# Patient Record
Sex: Female | Born: 1965 | Race: White | Hispanic: No | Smoking: Former smoker
Health system: Southern US, Community
[De-identification: ages and names within clinical notes are randomized; demographics above are authoritative.]

## PROBLEM LIST (undated history)

## (undated) DIAGNOSIS — T7840XA Allergy, unspecified, initial encounter: Secondary | ICD-10-CM

## (undated) DIAGNOSIS — F32A Depression, unspecified: Secondary | ICD-10-CM

## (undated) DIAGNOSIS — F329 Major depressive disorder, single episode, unspecified: Secondary | ICD-10-CM

## (undated) DIAGNOSIS — F419 Anxiety disorder, unspecified: Secondary | ICD-10-CM

## (undated) DIAGNOSIS — K219 Gastro-esophageal reflux disease without esophagitis: Secondary | ICD-10-CM

## (undated) HISTORY — DX: Allergy, unspecified, initial encounter: T78.40XA

## (undated) HISTORY — DX: Depression, unspecified: F32.A

## (undated) HISTORY — DX: Anxiety disorder, unspecified: F41.9

## (undated) HISTORY — DX: Major depressive disorder, single episode, unspecified: F32.9

## (undated) HISTORY — PX: TUBAL LIGATION: SHX77

## (undated) HISTORY — PX: WISDOM TOOTH EXTRACTION: SHX21

## (undated) HISTORY — PX: TONSILLECTOMY: SUR1361

## (undated) HISTORY — DX: Gastro-esophageal reflux disease without esophagitis: K21.9

---

## 1995-11-29 HISTORY — PX: CERVICAL CONIZATION W/BX: SHX1330

## 1996-05-30 HISTORY — PX: OTHER SURGICAL HISTORY: SHX169

## 2002-06-30 HISTORY — PX: ABDOMINAL HYSTERECTOMY: SHX81

## 2002-06-30 HISTORY — PX: APPENDECTOMY: SHX54

## 2004-09-02 ENCOUNTER — Ambulatory Visit: Payer: Self-pay | Admitting: Pulmonary Disease

## 2004-09-27 ENCOUNTER — Ambulatory Visit: Payer: Self-pay | Admitting: Pulmonary Disease

## 2004-10-23 ENCOUNTER — Ambulatory Visit: Payer: Self-pay | Admitting: Pulmonary Disease

## 2005-01-22 ENCOUNTER — Ambulatory Visit: Payer: Self-pay | Admitting: Pulmonary Disease

## 2005-07-08 ENCOUNTER — Ambulatory Visit: Payer: Self-pay | Admitting: Unknown Physician Specialty

## 2006-03-16 ENCOUNTER — Other Ambulatory Visit: Payer: Self-pay

## 2006-03-16 ENCOUNTER — Observation Stay: Payer: Self-pay | Admitting: Internal Medicine

## 2006-07-22 ENCOUNTER — Ambulatory Visit: Payer: Self-pay

## 2006-07-22 LAB — HM MAMMOGRAPHY

## 2006-09-21 ENCOUNTER — Ambulatory Visit: Payer: Self-pay | Admitting: Gynecology

## 2006-10-26 ENCOUNTER — Ambulatory Visit: Payer: Self-pay | Admitting: Gynecology

## 2007-06-22 ENCOUNTER — Ambulatory Visit: Payer: Self-pay | Admitting: Pulmonary Disease

## 2007-06-22 DIAGNOSIS — J4530 Mild persistent asthma, uncomplicated: Secondary | ICD-10-CM | POA: Insufficient documentation

## 2007-06-22 DIAGNOSIS — R5381 Other malaise: Secondary | ICD-10-CM

## 2007-06-22 DIAGNOSIS — R5383 Other fatigue: Secondary | ICD-10-CM

## 2007-06-30 ENCOUNTER — Encounter: Payer: Self-pay | Admitting: Critical Care Medicine

## 2007-07-16 ENCOUNTER — Ambulatory Visit: Payer: Self-pay | Admitting: Pulmonary Disease

## 2007-07-16 DIAGNOSIS — R942 Abnormal results of pulmonary function studies: Secondary | ICD-10-CM | POA: Insufficient documentation

## 2007-07-16 DIAGNOSIS — R0609 Other forms of dyspnea: Secondary | ICD-10-CM

## 2007-07-16 DIAGNOSIS — R0602 Shortness of breath: Secondary | ICD-10-CM | POA: Insufficient documentation

## 2007-07-16 DIAGNOSIS — R0989 Other specified symptoms and signs involving the circulatory and respiratory systems: Secondary | ICD-10-CM

## 2007-07-27 ENCOUNTER — Ambulatory Visit: Payer: Self-pay

## 2007-07-27 ENCOUNTER — Encounter: Payer: Self-pay | Admitting: Pulmonary Disease

## 2007-07-30 ENCOUNTER — Ambulatory Visit: Payer: Self-pay | Admitting: Unknown Physician Specialty

## 2007-07-30 LAB — HM COLONOSCOPY

## 2007-08-03 ENCOUNTER — Ambulatory Visit: Payer: Self-pay | Admitting: Pulmonary Disease

## 2007-08-03 DIAGNOSIS — G4710 Hypersomnia, unspecified: Secondary | ICD-10-CM

## 2007-11-11 ENCOUNTER — Ambulatory Visit: Payer: Self-pay | Admitting: Gynecology

## 2008-02-10 ENCOUNTER — Ambulatory Visit: Payer: Self-pay | Admitting: Nurse Practitioner

## 2008-07-07 ENCOUNTER — Ambulatory Visit: Payer: Self-pay | Admitting: Unknown Physician Specialty

## 2008-07-13 ENCOUNTER — Ambulatory Visit: Payer: Self-pay | Admitting: Unknown Physician Specialty

## 2008-11-14 ENCOUNTER — Ambulatory Visit: Payer: Self-pay | Admitting: Nurse Practitioner

## 2009-01-16 ENCOUNTER — Ambulatory Visit: Payer: Self-pay | Admitting: Obstetrics & Gynecology

## 2009-01-23 ENCOUNTER — Ambulatory Visit: Payer: Self-pay | Admitting: Unknown Physician Specialty

## 2009-02-20 ENCOUNTER — Ambulatory Visit: Payer: Self-pay | Admitting: Nurse Practitioner

## 2009-03-13 ENCOUNTER — Ambulatory Visit: Payer: Self-pay | Admitting: Nurse Practitioner

## 2009-04-10 ENCOUNTER — Ambulatory Visit: Payer: Self-pay | Admitting: Nurse Practitioner

## 2009-09-18 ENCOUNTER — Ambulatory Visit: Payer: Self-pay | Admitting: Nurse Practitioner

## 2010-01-23 ENCOUNTER — Ambulatory Visit: Payer: Self-pay | Admitting: Unknown Physician Specialty

## 2010-02-12 ENCOUNTER — Ambulatory Visit: Payer: Self-pay | Admitting: Otolaryngology

## 2010-03-12 ENCOUNTER — Ambulatory Visit: Payer: Self-pay | Admitting: Nurse Practitioner

## 2010-08-27 ENCOUNTER — Encounter (INDEPENDENT_AMBULATORY_CARE_PROVIDER_SITE_OTHER): Payer: BC Managed Care – PPO | Admitting: Nurse Practitioner

## 2010-08-27 DIAGNOSIS — G43109 Migraine with aura, not intractable, without status migrainosus: Secondary | ICD-10-CM

## 2010-08-28 ENCOUNTER — Encounter (INDEPENDENT_AMBULATORY_CARE_PROVIDER_SITE_OTHER): Payer: Self-pay | Admitting: *Deleted

## 2010-08-28 LAB — CONVERTED CEMR LAB: Hgb A1c MFr Bld: 5.2 % (ref <5.7)

## 2010-09-20 NOTE — Assessment & Plan Note (Unsigned)
Hannah [name], CENTANNI NO.:  [id number]  MEDICAL RECORD NO.:  [id number]           PATIENT TYPE:  LOCATION:  [facility] at [facility]           FACILITY:  PHYSICIAN:  Remonia Richter, NP   DATE OF BIRTH:  03-01-1966  DATE OF SERVICE:  08/27/2010                                 CLINIC NOTE  HISTORY OF PRESENT ILLNESS:  The patient comes to the office today for headaches and followup on her headache and some ongoing vertigo.  The patient has been having episodes of vertigo since last summer.  The episodes seem to happen randomly.  She feels that the room shifts side to side lasting 2-5 minutes.  She does have some nausea that resolves after about 20 minutes with no vomiting.  She had an episode that happened severely the summer causing her to actually fall.  She also has some issues with driving when she has some distortion of her hands on the wheels.  She did see her ear, nose, and throat physician who did some positioning testing as well as hearing testing and an MRI of her head all were negative.  She also saw her regular internist who did not have any advice for her episodes of vertigo.  Vital signs blood pressure is 114/76, pulse 73, weight 150, and height is 5 feet.  ASSESSMENT: 1. Migraine headache. 2. Vertigo.  PLAN:  After a very lengthy discussion, we have decided today that we will drop her Wellbutrin from 150 mg down to 75 mg.  She will get a prescription for #30 with 3 refills, and this added to the 200 mg of Topamax may be the culprit.  We will give this a trial.  She is also asking to have her blood sugar checked today as her father is a diabetic and this runs in her family.  She will have a hemoglobin A1c drawn today.  The patient is asked to call back to the office if she has any issues that are ongoing, otherwise we will see her back in 6 weeks.     Remonia Richter, NP    LR/MEDQ  D:  08/27/2010  T:  08/28/2010  Job:  [id number]

## 2010-10-15 ENCOUNTER — Encounter (INDEPENDENT_AMBULATORY_CARE_PROVIDER_SITE_OTHER): Payer: BC Managed Care – PPO | Admitting: Nurse Practitioner

## 2010-10-15 DIAGNOSIS — G43109 Migraine with aura, not intractable, without status migrainosus: Secondary | ICD-10-CM

## 2010-10-16 NOTE — Assessment & Plan Note (Unsigned)
NAMEMarland Kitchen  Hannah, BARBIAN NO.:  [id number]  MEDICAL RECORD NO.:  [id number]           PATIENT TYPE:  LOCATION:  [facility] at [facility]           FACILITY:  PHYSICIAN:  Argentina Donovan, MD             DATE OF BIRTH:  DATE OF SERVICE:  10/15/2010                                 CLINIC NOTE  SUBJECTIVE:  The patient comes to the office today for followup on her migraine headaches.  The patient was last seen in February 2012.  At that time, she was complaining of episodes that she was having when she was driving that involved some distortion of her hands on the wheels. She has seen ENT who had done some testing of her position as well as hearing tests and MRI which were all negative.  She had also seen her regular internist who did not have any advice for her.  We did check her hemoglobin A1c which was negative.  We also dropped her Wellbutrin down from 150 mg to 75 mg.  That did seem to be the answer for her.  She has not had any of those episodes.  There were several times when she felt she may have one, but it did not actually occur.  She does feel more irritable on the decreased dose of Wellbutrin.  She has no irritability with her husband in general.  The patient is also complaining of upper respiratory infection.  Her husband and daughter are both sick.  She does not believe this is related to allergy.  She has not had any fever. Symptoms have been ongoing for approximately 1 week.  OBJECTIVE:  VITAL SIGNS:  Blood pressure 105/73, weight 147, and pulse is 75. HEENT:  Head is normocephalic and atraumatic.  Pupils are equal and react. CARDIAC:  Regular rate and rhythm. LUNGS:  Clear bilaterally. NEUROLOGICAL:  The patient is alert and oriented.  Good mood.  Good muscle tone.  Good affect.  ASSESSMENT: 1. Migraine. 2. Upper respiratory infection. 3. Irritability.  PLAN:  We had a lengthy discussion today.  The patient is advised to seek counseling.  She was given  the card from the counselor that we use in this office and will follow up with her.  She is not interested in moving her medications around even further.  She feels that if she goes even lower on her Wellbutrin that it may be the cause of her having more irritability within her marriage.  She is given Z-Pak to take as directed in the event that her symptoms progressed to fever, cough with yellow or green sputum.  She will follow up in 6 months or sooner as need be.     Remonia Richter, NP   ______________________________ Argentina Donovan, MD  LR/MEDQ  D:  10/15/2010  T:  10/16/2010  Job:  [id number]

## 2010-11-12 NOTE — Assessment & Plan Note (Signed)
NAMEARRAYAH, CONNORS               ACCOUNT NO.:  [id number]   MEDICAL RECORD NO.:  [id number]          PATIENT TYPE:  POB   LOCATION:  [facility] at [facility]         FACILITY:  [facility]   PHYSICIAN:  Remonia Richter, NP   DATE OF BIRTH:  [birth year]   DATE OF SERVICE:                                  CLINIC NOTE   The patient comes to office today for follow up on her migraine  headaches.  She did call Dr. Lafayette Dragon who okayed the switch from Lamictal to  Topamax.  She did not feel that it would be any issue with her  depression.  Since the patient has started Topamax, she is down 4  pounds, and she is pleased with that.  She is currently taking Lamictal  at 200 mg and she is currently up to 75 mg on the Topamax.  She is doing  great overall with her headaches.  She did have one of that headache and  that is related to taking her son to the emergency room following an  emergency room visit and having to wait and watch her son in pain for 6  hours.   EXAM:  VITAL SIGNS:  Blood pressure is 113/76, pulse is 66, weight is  162.   ALLERGIES:  The patient is allergic to SULFA.   ASSESSMENT:  1. Migraine headaches  2. Depression.   PLAN:  We have discussed lowering the dose of Lamictal.  The patient is  comfortable taking her 200 mg tablet of Lamictal and breaking it in  half, and she will be taking 100 mg at bedtime for 4 weeks.  She will  also go up on her Topamax to 100 mg tonight.  After that we will likely  go down to the 50 mg on the Lamictal.  The patient will return to the  clinic in 4 weeks.  She is encouraged to take Aleve around stressful  events.      Remonia Richter, NP     LR/MEDQ  D:  03/13/2009  T:  03/14/2009  Job:  [id number]

## 2010-11-12 NOTE — Assessment & Plan Note (Signed)
Hannah [name], Hannah [name]               ACCOUNT NO.:  [id number]   MEDICAL RECORD NO.:  [id number]          PATIENT TYPE:  POB   LOCATION:  [facility] at [facility]         FACILITY:  [facility]   PHYSICIAN:  Tinnie Gens, MD        DATE OF BIRTH:  May 15, 1966   DATE OF SERVICE:  09/18/2009                                  CLINIC NOTE   The patient comes into the office today for followup on her migraine  headaches.  She was seen since 6 months ago.  Since her last office  visit, she has tapered down and off of her Lamictal and gone up to 200  mg of Topamax.  She states that she cannot remember the last time she  had a headache that she continues to carry her Triptan in her pocket,  but that is actually become dusty.  She is interested in decreasing the  dose of the Topamax due to some memory issues and has decided that she  wants to go down to 150.  She has lost a total of 11 pounds which is  great for her.  She is also suffering from extensive allergies at this  point.  She has been to see her primary care doctor and is currently on  prednisone.   PHYSICAL EXAMINATION:  VITAL SIGNS:  Blood pressure is 102/66, pulse 81,  weight 154, height is 5 feet 5.   ALLERGIES:  SULFA, ERYTHROMYCIN, and TETRACYCLINE.   ASSESSMENT:  1. Migraine.  2. Allergy.   PLAN:  1. The patient will be referred to see an allergist in [place].      She will continue on her allergy medicines that she has now.  2. We will decrease her Topamax to 50 mg 3 tablets at bedtime #90 with      6 refills.  She is given permission to play with that dosing.  If      she wants to try to go down to 100, that is fine.  We will see her      back in 6 months or sooner as need be.      Remonia Richter, NP    ______________________________  Tinnie Gens, MD    LR/MEDQ  D:  09/18/2009  T:  09/18/2009  Job:  [id number]

## 2010-11-12 NOTE — Assessment & Plan Note (Signed)
NAMESHAMERA, YARBERRY               ACCOUNT NO.:  [id number]   MEDICAL RECORD NO.:  [id number]          PATIENT TYPE:  POB   LOCATION:  [facility] at [facility]         FACILITY:  [facility]   PHYSICIAN:  Jaynie Collins, MD     DATE OF BIRTH:  [birth year]   DATE OF SERVICE:  02/20/2009                                  CLINIC NOTE   The patient comes to the office today for followup on her migraine  headaches.  The patient states that she is having approximately 2-3  moderate-to-severe headaches per week.  She has been taking her Relpax  and Aleve and that has been working relatively well for her, but she  would like to not have as many headaches.  She currently is on Lamictal  as well as Wellbutrin for stabilization of her depression which is  working very well.  She currently sees Dr. Lafayette Dragon for psychiatric care.   PHYSICAL EXAMINATION:  VITAL SIGNS:  Blood pressure is 99/63, weight is  166, height is 5 foot, pulse is 74.   ALLERGIES:  To SULFA.   ASSESSMENT:  Migraine headaches.   PLAN:  We had a rather lengthy discussion today concerning triggers.  She was asked to eat a lunch so as not to trigger a headache in the  early afternoon.  We also asked her to be aware of tension headaches and  when she gets a tension headache, she will take a Norflex one p.o. q.8  h. as prescribed.  We also talked about starting Topamax at a low dose.  She will start 25 mg for 1 week, up to 50 for 1 week, and then up to 75  for 1 week until seen.  She does think that she is gaining weight on the  Lamictal.  She will call her psychiatrist, Dr. Lafayette Dragon, and ask her if she  has any disagreement with switching over the Topamax for the Lamictal.  If she does not have any issues, we will slowly switch her over.  She  will return to the clinic in 3 weeks.      Remonia Richter, NP    ______________________________  Jaynie Collins, MD    LR/MEDQ  D:  02/20/2009  T:  02/21/2009  Job:  [id number]

## 2010-11-12 NOTE — Assessment & Plan Note (Signed)
NAMEANAE, HAMS               ACCOUNT NO.:  [id number]   MEDICAL RECORD NO.:  [id number]          PATIENT TYPE:  POB   LOCATION:  [facility]         FACILITY:  [facility]   PHYSICIAN:  Scheryl Darter, MD       DATE OF BIRTH:  [birth year]   DATE OF SERVICE:  01/16/2009                                  CLINIC NOTE   The patient comes for yearly physical exam.  The patient is a 45-year-  old white female, gravida 3, para 2, abortus 1, status post laparoscopic  assisted vaginal hysterectomy in 2004 for endometriosis.  The patient is  on Premarin for hormone replacement.  She has good control of vasomotor  symptoms.  She has diagnosis of migraine headaches and she has to treat  these fairly frequently with Relpax which does work well.  She says she  may have had to do this 3 times in the last week.  She still does not  want to start any more medication for prevention, however.   PAST MEDICAL HISTORY:  1. Moderate obesity.  2. Migraine headaches.  3. Asthma and allergies.  4. Depression.   MEDICATIONS:  1. Lamictal 200 mg p.o. daily.  2. Wellbutrin XL 150 mg a day.  3. Premarin 0.625 mg a day.  4. Zyrtec over the counter 1 a day.  5. Flonase 2 sprays every morning.  6. Advair inhaler 2 puffs b.i.d.  7. Relpax 40 mg 1 p.o. p.r.n. headache.   FAMILY HISTORY:  Colon cancer in her brother, who is 67.  Her son also  had a colonoscopy which, she believes, showed some colonic polyps but  she is not sure what the diagnosis is.   REVIEW OF SYSTEMS:  No vasomotor symptoms, urinary symptoms, vaginal  discharge, or dryness.  She has frequent headaches.   PHYSICAL EXAMINATION:  GENERAL:  The patient is in no acute distress.  VITAL SIGNS:  Weight is 160 pounds, height 5 feet 0, BP is 99/67, and  pulse 78.  CHEST:  Clear.  HEART:  Regular rate and rhythm.  NECK:  No thyromegaly.  BREASTS:  Symmetric.  No mass, tenderness, or axillary lymphadenopathy.  ABDOMEN:  Soft, nontender, no  mass.  EXTREMITIES:  No swelling.  EXTERNAL GENITALIA:  Vagina showed good estrogen effect and good  support.  No pelvic masses or tenderness.  Pap smear was deferred today.   IMPRESSION:  The patient is status post hysterectomy for endometriosis  with frequent headaches, currently on Relpax.  She does not want to  start a preventive regimen for her migraines at this time as she thinks  she is already on too much medication.   PLAN:  The patient will report back if she wants to see Remonia Richter, NP, for further management of her headaches.  She sees Dr.  Elease Hashimoto for her other medical care.  She should return for yearly exams.  I recommend she schedule screening mammography.      Scheryl Darter, MD     JA/MEDQ  D:  01/16/2009  T:  01/17/2009  Job:  [id number]

## 2010-11-12 NOTE — Assessment & Plan Note (Signed)
Hannah [name], Hannah [name]               ACCOUNT NO.:  [id number]   MEDICAL RECORD NO.:  [id number]          PATIENT TYPE:  POB   LOCATION:  [facility] at [facility]         FACILITY:  [facility]   PHYSICIAN:  Tinnie Gens, MD        DATE OF BIRTH:  30-Nov-1965   DATE OF SERVICE:  11/14/2008                                  CLINIC NOTE   The patient comes to office today for followup on her headaches.  The  patient was last seen in August 2009.  She was asked to follow up in 6  months or sooner.  This is her first headache follow up.  Since then,  she has called in several times for prescriptions for Vicodin and  Darvocet.  She states that she had side effects with the Imitrex and she  has been taking the pain medicine.  She had been doing very well with  her headaches up until approximately 1 month ago and now she is actually  having many more headaches.  She states now she is having an headache  approximately every other day.  She is unsure of what the trigger is.  She is considered diet sodas, allergies, asthma, thyroid issues, and  some body and joint aches.  Things are going well for her at home.  She  does not have any stressors including marriage, job, money.  She does  not feel that those things are an issue for her.   PHYSICAL EXAMINATION:  VITAL SIGNS:  Blood pressure is 118/68, pulse is  81, weight is 165, height is 5 feet, 0 inch.   ASSESSMENT:  Migraine headache.   PLAN:  We did have a rather lengthy discussion today probably over 30  minutes.  We originally switched her prescription to Maxalt.  She did  call back later in the day and say that her insurance would not pay for  Maxalt, so we will change that to Relpax 40 mg one p.o. p.r.n. migraine  limit 2 tablets in a 24-hour #9 with 6 refills.  She is also asked to  add Anaprox to that.  She is also asked to consider prevention.  She is  struggling with some weight loss and so we have suggested Topamax due to  the weight loss  properties.  She is asked to return on an as-needed  basis.       Remonia Richter, NP    ______________________________  Tinnie Gens, MD    LR/MEDQ  D:  11/14/2008  T:  11/15/2008  Job:  [id number]

## 2010-11-12 NOTE — Assessment & Plan Note (Signed)
Hannah [name], POLLINA               ACCOUNT NO.:  [id number]   MEDICAL RECORD NO.:  [id number]          PATIENT TYPE:  POB   LOCATION:  [facility]         FACILITY:  [facility]   PHYSICIAN:  Ginger Carne, MD DATE OF BIRTH:  Feb 13, 1966   DATE OF SERVICE:  11/11/2007                                  CLINIC NOTE   HISTORY OF PRESENT ILLNESS:  [name] is here today for her routine  gynecological evaluation.  She had utilized Premarin vaginal cream in  the past and discontinued it and now continues to have dyspareunia.  She  was prescribed this medication about 1 year ago.  Also her Premarin  tablets 0.625 are working well for her.  She had been seen by Dr. Darrol Angel  at the [facility] pulmonology department and a sleep apnea study was  ordered.  The patient had a pulmonary infection in the early spring of  this year and needs to reschedule said testing.  She had a mammogram  about 1 year ago which was ordered by Dr. Elease Hashimoto.  I do not have the  results, but she states they were normal.  She has no GI, GU or cardiac  complaints.  No new family history of cancer.  Her father has been a  changed from type 2 to type 1 diabetes mellitus.  Her physical  examination is essentially normal and the record is in the chart.   PLAN:  Renew Premarin 0.625 mg daily #30 with 12 refills and Premarin  vaginal cream 1 gram to be used two-to-three times a week intravaginally  as needed.  The patient will return on a p.r.n. basis.  Otherwise 1-year  visit at that time, a mammogram will be ordered.  Of note is the patient  did have in March 2006 a DEXA scan which was normal.           ______________________________  Ginger Carne, MD     SHB/MEDQ  D:  11/11/2007  T:  11/11/2007  Job:  [id number]

## 2010-11-12 NOTE — Assessment & Plan Note (Signed)
NAMEKHRISTIN, KELEHER               ACCOUNT NO.:  [id number]   MEDICAL RECORD NO.:  [id number]          PATIENT TYPE:  POB   LOCATION:  [facility]         FACILITY:  [facility]   PHYSICIAN:  Tinnie Gens, MD        DATE OF BIRTH:  22-Oct-1965   DATE OF SERVICE:  04/10/2009                                  CLINIC NOTE   The patient comes to office today for followup on migraine headaches.  The patient has been doing very well.  She is currently on 100 mg of  Topamax and 100 mg of Lamictal.  She takes each one of these at bedtime.  She was very impressed because she has not had any headache.  Her  beloved dog of 10 years died, she cried extensively and did not have a  headache after that.  We have decided that she will go up on her Topamax  ultimately getting to 200 and down on her Lamictal and off.   PHYSICAL EXAMINATION:  VITAL SIGNS:  Blood pressure is 115/74, pulse is  64, weight 161, height 5 feet 5 inches.   ALLERGIES:  SULFA.   ASSESSMENT:  Migraine headaches.   PLAN:  The patient will go up to Topamax 150 mg for 4 weeks and then up  to 200 mg.  She was given a prescription for Topamax 200 mg 1 p.o.  nightly #90 with 1 refill.  She will go down on her Lamictal to 50 mg  for 4 weeks and then go off.  The patient is asked to increase her  fluids.  She will return to the clinic in 6 months or sooner as need be.      Remonia Richter, NP    ______________________________  Tinnie Gens, MD    LR/MEDQ  D:  04/10/2009  T:  04/11/2009  Job:  [id number]

## 2010-11-12 NOTE — Assessment & Plan Note (Signed)
Hannah [name], Hannah [name]               ACCOUNT NO.:  [id number]   MEDICAL RECORD NO.:  [id number]          PATIENT TYPE:  POB   LOCATION:  [facility]         FACILITY:  [facility]   PHYSICIAN:  Elsie Lincoln, MD      DATE OF BIRTH:  07-Apr-1966   DATE OF SERVICE:  02/10/2008                                  CLINIC NOTE   The patient comes to the office today for consultation for migraine  headache.  The patient's headache started approximately at the age of  45.  She denies any aura.  Her headaches occur in her neck and her  temples and sometimes can involve her entire head.  She does have  sensitivity to light and sound.  She rarely has nausea or vomiting.  She  is currently having one severe per month, four moderates per month, two  milds per month.  She does has been taking Fiorinal With Codeine given  to her by Dr. Blima Rich.  She has never taken a Triptan.  She does  currently see Dr. Evelene Croon for depression.  She is currently on Lamictal as  well as Wellbutrin.  She does feel that her headache triggers include  stress as well as weather.  She has currently had a complete  hysterectomy for endometriosis and is currently on Premarin.  She denies  any family history of heart disease.  She does not have high blood  pressure.  She has not had her cholesterol checked.  She does not have  any cardiac issues herself personally.   PHYSICAL EXAMINATION:  VITAL SIGNS:  Blood pressure is 94/63, weight is  158, height is 5 feet 5 inches, pulse is 59.  GENERAL:  Well-developed, well-nourished, 45 year old female in no acute  distress.  HEENT:  Head is normocephalic and atraumatic.  NEUROLOGICALLY:  The patient is alert.  She is oriented.  She is well  spoken.  Her speech is fluent.  She has good muscle coordination and  sensation.  CARDIAC:  Regular rate and rhythm.  No murmurs, rubs, or thrills.  LUNGS:  Clear bilaterally without rales, rhonchi, or wheezes.   ASSESSMENT/PLAN:  Migraine  headache without aura.  The patient does not  need prevention at this point.  She is doing very well with the number  of headaches that she has.  We will start her on Imitrex to take 1 p.o.  p.r.n. migraine.   She will follow up in 6 months or sooner as need be.      Remonia Richter, NP    ______________________________  Elsie Lincoln, MD    LR/MEDQ  D:  02/10/2008  T:  02/11/2008  Job:  [id number]

## 2010-11-12 NOTE — Assessment & Plan Note (Signed)
[name], [name]               ACCOUNT NO.:  [id number]   MEDICAL RECORD NO.:  [id number]          PATIENT TYPE:  POB   LOCATION:  [facility]         FACILITY:  [facility]   PHYSICIAN:  Allie Bossier, MD        DATE OF BIRTH:  1965/09/04   DATE OF SERVICE:  03/12/2010                                  CLINIC NOTE   The patient comes to office today for a followup on her migraine  headaches.  She was last seen in April 2011, at that time she was doing  very well and had decided to drop her Topamax to 150, perhaps down to  100.  When she went to 150, her headaches immediately came back and she  started having to take her Relpax again.  She decided that, that was not  going to work for her, called the office, and had her Topamax increased  to 200 mg again.  Since our last office visit, her brother's colon  cancer has returned, is now in his liver, and he has restarted  chemotherapy, deals with her mother's and father's illnesses, both have  caused her to have some increased stress in her life.  She also has a  daughter who has migraines who is having some hormonal issues.   PHYSICAL EXAMINATION:  GENERAL:  A well-developed, well-nourished 45-  year-old Caucasian female, in no acute distress.  VITAL SIGNS:  Blood pressure is 97/73, pulse 69, weight 149, height is 5  feet.  HEENT:  Head is normocephalic and atraumatic.  Pupils equal and  reactive.  NEUROLOGIC:  The patient is alert and oriented.  She has no difficulty  with memory or cognitive issues.  She has good muscle tone and  coordination.   ASSESSMENT:  Migraine headaches.   PLAN:  We will give her a prescription for Topamax 200 mg 1 p.o. q.h.s.,  #30 with p.r.n. refills as well as Relpax 40 mg 1 p.o. p.r.n. migraine,  #9 with p.r.n. refills.  The patient is asked to return in 1 year or  sooner as need be.      Remonia Richter, NP    ______________________________  Allie Bossier, MD    LR/MEDQ  D:  03/12/2010   T:  03/13/2010  Job:  [id number]

## 2011-01-23 ENCOUNTER — Ambulatory Visit: Payer: Self-pay | Admitting: Unknown Physician Specialty

## 2011-04-15 ENCOUNTER — Ambulatory Visit (INDEPENDENT_AMBULATORY_CARE_PROVIDER_SITE_OTHER): Payer: BC Managed Care – PPO | Admitting: Nurse Practitioner

## 2011-04-15 ENCOUNTER — Encounter: Payer: Self-pay | Admitting: Nurse Practitioner

## 2011-04-15 VITALS — BP 97/60 | HR 64 | Wt 154.0 lb

## 2011-04-15 DIAGNOSIS — F329 Major depressive disorder, single episode, unspecified: Secondary | ICD-10-CM

## 2011-04-15 NOTE — Progress Notes (Deleted)
Diagnosis:***  Location:***  Number of Headache days/month: Severe:*** Moderate:*** Mild:***  No current outpatient prescriptions on file prior to visit.    Acute prevention:***  Past Medical History  Diagnosis Date  . Migraine   . Allergy   . Asthma   . Depression    Past Surgical History  Procedure Date  . Appendectomy   . Abdominal hysterectomy   . Wisdom tooth extraction     x 4  . Tonsillectomy   . Cesarean section      x2   Family History  Problem Relation Age of Onset  . Arthritis Mother   . Depression Mother   . Diabetes Father   . Parkinsonism Father   . Depression Sister   . Cancer Brother     colon, lungs, liver  . Diabetes Paternal Grandmother    Social History:  reports that she has quit smoking. Her smoking use included Cigarettes. She does not have any smokeless tobacco history on file. She reports that she drinks alcohol. She reports that she does not use illicit drugs. Allergies:  Allergies  Allergen Reactions  . Sulfonamide Derivatives   . Tetracyclines & Related     Triggers:***  Birth control:***  ROS:***  Exam:***  Impression:{headache NW:295621}  Plan***  Time Spent:***

## 2011-04-15 NOTE — Progress Notes (Signed)
S: Pt is in office today for follow up on migraine headache. Headaches are doing well. She feels her depression is worse in the last week. Maybe related to weather changes. She also has brother who has cancer and both parents are ill.  We dropped her Wellbutrin down to 75mg  at last visit related to some unusual side effects in which she felt her hands were enlarging on the steering wheel. She was advised at last visit to seek counseling related to some issues with her husband. She has not done that as yet.  O: Cauc female NAD, tearful today. Neuro negative  A: migraine, depression  P: Given name of Dr Merry Proud at [facility] for counseling. Pt declined any medication change in antidepressants. She will call if she changes her mind. Refills of meds prn

## 2011-04-29 ENCOUNTER — Telehealth: Payer: Self-pay | Admitting: *Deleted

## 2011-04-29 MED ORDER — ESTROGENS CONJUGATED 0.625 MG PO TABS: 0.6250 mg | ORAL_TABLET | Freq: Every day | ORAL | Status: DC

## 2011-04-29 NOTE — Telephone Encounter (Signed)
Pharmacy sent request for refill.

## 2011-10-23 ENCOUNTER — Telehealth: Payer: Self-pay | Admitting: *Deleted

## 2011-10-23 MED ORDER — TOPIRAMATE 200 MG PO TABS: 200.0000 mg | ORAL_TABLET | Freq: Every day | ORAL | Status: DC

## 2011-10-23 NOTE — Telephone Encounter (Signed)
Patient is calling for a refill of her Topamax 200mg  qhs.  She is doing well with this.

## 2011-11-13 ENCOUNTER — Telehealth: Payer: Self-pay | Admitting: *Deleted

## 2011-11-13 NOTE — Telephone Encounter (Signed)
Patient called because her last rx we refilled for Topamax was not at the pharmacy.  I called and spoke to the pharmacist directly to confirm it was ok to call in 6 refills.

## 2012-04-05 ENCOUNTER — Telehealth: Payer: Self-pay | Admitting: *Deleted

## 2012-04-05 DIAGNOSIS — G43909 Migraine, unspecified, not intractable, without status migrainosus: Secondary | ICD-10-CM

## 2012-04-05 MED ORDER — ELETRIPTAN HYDROBROMIDE 20 MG PO TABS: 20.0000 mg | ORAL_TABLET | ORAL | Status: DC | PRN

## 2012-04-05 NOTE — Telephone Encounter (Signed)
Patient needs a refill of her relpax and will go ahead and make an appointment, since it has been a year.

## 2012-04-20 ENCOUNTER — Ambulatory Visit (INDEPENDENT_AMBULATORY_CARE_PROVIDER_SITE_OTHER): Payer: BC Managed Care – PPO | Admitting: Nurse Practitioner

## 2012-04-20 ENCOUNTER — Encounter: Payer: Self-pay | Admitting: Nurse Practitioner

## 2012-04-20 VITALS — BP 94/66 | HR 67 | Ht 59.0 in | Wt 154.0 lb

## 2012-04-20 DIAGNOSIS — G43909 Migraine, unspecified, not intractable, without status migrainosus: Secondary | ICD-10-CM

## 2012-04-20 DIAGNOSIS — Z23 Encounter for immunization: Secondary | ICD-10-CM

## 2012-04-20 MED ORDER — TOPIRAMATE 200 MG PO TABS: 200.0000 mg | ORAL_TABLET | Freq: Every day | ORAL | Status: DC

## 2012-04-20 MED ORDER — ELETRIPTAN HYDROBROMIDE 40 MG PO TABS: 40.0000 mg | ORAL_TABLET | ORAL | Status: DC | PRN

## 2012-04-20 NOTE — Progress Notes (Signed)
S: Pt returns today for yearly visit for migraine. She has done well with her headache numbers. About 8 months ago she discontinued her Wellbutrin. She has done well with this change and has had no further side effects. No more vertigo. She has had a hard year related to parents, brother and daughters health issues.   O: Alert, oriented, NAD Cardiac : RRR Lungs: clear bilat Neuro: Negative  A: Migraine headaches  P: Refill Relpax and Topamax. RTC one year or prn

## 2012-04-20 NOTE — Patient Instructions (Signed)
Migraine Headache A migraine headache is an intense, throbbing pain on one or both sides of your head. A migraine can last for 30 minutes to several hours. CAUSES  The exact cause of a migraine headache is not always known. However, a migraine may be caused when nerves in the brain become irritated and release chemicals that cause inflammation. This causes pain. SYMPTOMS  Pain on one or both sides of your head.  Pulsating or throbbing pain.  Severe pain that prevents daily activities.  Pain that is aggravated by any physical activity.  Nausea, vomiting, or both.  Dizziness.  Pain with exposure to bright lights, loud noises, or activity.  General sensitivity to bright lights, loud noises, or smells. Before you get a migraine, you may get warning signs that a migraine is coming (aura). An aura may include:  Seeing flashing lights.  Seeing bright spots, halos, or zig-zag lines.  Having tunnel vision or blurred vision.  Having feelings of numbness or tingling.  Having trouble talking.  Having muscle weakness. MIGRAINE TRIGGERS  Alcohol.  Smoking.  Stress.  Menstruation.  Aged cheeses.  Foods or drinks that contain nitrates, glutamate, aspartame, or tyramine.  Lack of sleep.  Chocolate.  Caffeine.  Hunger.  Physical exertion.  Fatigue.  Medicines used to treat chest pain (nitroglycerine), birth control pills, estrogen, and some blood pressure medicines. DIAGNOSIS  A migraine headache is often diagnosed based on:  Symptoms.  Physical examination.  A CT scan or MRI of your head. TREATMENT Medicines may be given for pain and nausea. Medicines can also be given to help prevent recurrent migraines.  HOME CARE INSTRUCTIONS  Only take over-the-counter or prescription medicines for pain or discomfort as directed by your caregiver. The use of long-term narcotics is not recommended.  Lie down in a dark, quiet room when you have a migraine.  Keep a journal  to find out what may trigger your migraine headaches. For example, write down:  What you eat and drink.  How much sleep you get.  Any change to your diet or medicines.  Limit alcohol consumption.  Quit smoking if you smoke.  Get 7 to 9 hours of sleep, or as recommended by your caregiver.  Limit stress.  Keep lights dim if bright lights bother you and make your migraines worse. SEEK IMMEDIATE MEDICAL CARE IF:   Your migraine becomes severe.  You have a fever.  You have a stiff neck.  You have vision loss.  You have muscular weakness or loss of muscle control.  You start losing your balance or have trouble walking.  You feel faint or pass out.  You have severe symptoms that are different from your first symptoms. MAKE SURE YOU:   Understand these instructions.  Will watch your condition.  Will get help right away if you are not doing well or get worse. Document Released: 06/16/2005 Document Revised: 09/08/2011 Document Reviewed: 06/06/2011 [employer] Patient Information 2013 [employer].  

## 2012-05-13 ENCOUNTER — Other Ambulatory Visit: Payer: Self-pay | Admitting: *Deleted

## 2012-05-13 DIAGNOSIS — G43909 Migraine, unspecified, not intractable, without status migrainosus: Secondary | ICD-10-CM

## 2012-05-13 MED ORDER — ELETRIPTAN HYDROBROMIDE 40 MG PO TABS: 40.0000 mg | ORAL_TABLET | ORAL | Status: DC | PRN

## 2012-05-13 NOTE — Telephone Encounter (Signed)
Patients relpax rx expired.  Refills called to [facility].

## 2012-12-13 ENCOUNTER — Ambulatory Visit: Payer: Self-pay | Admitting: Orthopedic Surgery

## 2013-01-18 ENCOUNTER — Ambulatory Visit: Payer: Self-pay | Admitting: Orthopedic Surgery

## 2013-01-18 LAB — CBC
MCH: 28.3 pg (ref 26.0–34.0)
MCHC: 33.9 g/dL (ref 32.0–36.0)
MCV: 84 fL (ref 80–100)
Platelet: 265 10*3/uL (ref 150–440)
RDW: 12.9 % (ref 11.5–14.5)

## 2013-01-18 LAB — BASIC METABOLIC PANEL
BUN: 16 mg/dL (ref 7–18)
Chloride: 106 mmol/L (ref 98–107)
Creatinine: 0.8 mg/dL (ref 0.60–1.30)
EGFR (African American): >60
EGFR (Non-African Amer.): >60
Osmolality: 277 (ref 275–301)

## 2013-01-18 LAB — PROTIME-INR
INR: 1
Prothrombin Time: 13.6 secs (ref 11.5–14.7)

## 2013-01-18 LAB — APTT: Activated PTT: 31.4 secs (ref 23.6–35.9)

## 2013-01-24 ENCOUNTER — Ambulatory Visit: Payer: Self-pay | Admitting: Orthopedic Surgery

## 2014-01-04 ENCOUNTER — Telehealth: Payer: Self-pay | Admitting: *Deleted

## 2014-01-04 DIAGNOSIS — G43911 Migraine, unspecified, intractable, with status migrainosus: Secondary | ICD-10-CM

## 2014-01-04 MED ORDER — TOPIRAMATE 200 MG PO TABS: 200.0000 mg | ORAL_TABLET | Freq: Every day | ORAL | Status: DC

## 2014-01-04 NOTE — Telephone Encounter (Signed)
Pharmacy has sent request for refill of topamax 200mg .   Authorized refills for patient.

## 2014-05-01 ENCOUNTER — Encounter: Payer: Self-pay | Admitting: Nurse Practitioner

## 2014-05-16 LAB — LIPID PANEL
Cholesterol: 220 mg/dL — AB (ref 0–200)
HDL: 78 mg/dL — AB (ref 35–70)
LDL Cholesterol: 117 mg/dL
TRIGLYCERIDES: 123 mg/dL (ref 40–160)

## 2014-05-16 LAB — CBC AND DIFFERENTIAL
HCT: 38 % (ref 36–46)
HEMOGLOBIN: 12.9 g/dL (ref 12.0–16.0)
PLATELETS: 260 10*3/uL (ref 150–399)
WBC: 9.9 10*3/mL

## 2014-05-16 LAB — BASIC METABOLIC PANEL
BUN: 18 mg/dL (ref 4–21)
Creatinine: 0.8 mg/dL (ref 0.5–1.1)
Glucose: 86 mg/dL
Potassium: 4.2 mmol/L (ref 3.4–5.3)
Sodium: 137 mmol/L (ref 137–147)

## 2014-05-16 LAB — HEPATIC FUNCTION PANEL
ALT: 28 U/L (ref 7–35)
AST: 22 U/L (ref 13–35)

## 2014-05-16 LAB — TSH: TSH: 4.11 u[IU]/mL (ref 0.41–5.90)

## 2014-05-24 ENCOUNTER — Ambulatory Visit: Payer: Self-pay | Admitting: Unknown Physician Specialty

## 2014-10-13 ENCOUNTER — Other Ambulatory Visit: Payer: Self-pay | Admitting: Unknown Physician Specialty

## 2014-10-13 DIAGNOSIS — E041 Nontoxic single thyroid nodule: Secondary | ICD-10-CM

## 2014-10-20 NOTE — Op Note (Signed)
PATIENT NAME:  Hannah [name], Hannah [name] MR#:  [id number] DATE OF BIRTH:  [birth year]  DATE OF PROCEDURE:  01/24/2013  PREOPERATIVE DIAGNOSIS: Left shoulder partial versus full-thickness tear of the supraspinatus.   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   PROCEDURE:  Left shoulder arthroscopy with lysis of adhesions and partial synovectomy. arthroscopic subacromial decompression and mini open rotator cuff repair.   SURGEON: [place] Park, M.D.   ANESTHESIA: General with left interscalene block.   ESTIMATED BLOOD LOSS: Minimal.   COMPLICATIONS: None.   INDICATIONS FOR PROCEDURE: Ms. Schiller is a 49 year old female who has had symptoms of left shoulder pain for over a year. She had failed all conservative management including physical therapy, nonsteroidal anti-inflammatories and corticosteroid injection. The patient had significant pain and limitation of motion. Her MR arthrogram was suggestive of a tear of the supraspinatus. I reviewed the risks and benefits of surgery with the patient in my office prior to the date of surgery. She understood the risks of surgery included infection, bleeding, nerve or blood vessel injury including injury to the axillary nerve leading to numbness of her arm or weakness of the deltoid muscle, shoulder stiffness, re-tear of the tendon, failure of the hardware and the need for further surgery. Medical complications included but were not limited to DVT and pulmonary embolism, myocardial infarction, stroke, pneumonia, respiratory failure and death.   The patient signed the consent form in the office prior to the day of surgery.   IMPLANTS: ArthroCare Magnum M anchor x 1, ArthroCare Magnum 2 anchors x 3.   PROCEDURE NOTE:  The patient was met in the preoperative area. I updated the patient's history and physical. I also marked the left shoulder with the word "yes" according to  the hospital's right site protocol. I verbally confirmed with the patient that this was the correct site of surgery and also confirmed this with my office notes and the radiographic studies. The patient then had an interscalene block placed by the anesthesia service. She was then brought to the Operating Room where she was placed supine on the operative table. She underwent general endotracheal intubation. The patient was then positioned in the beach chair position. All bony prominences were adequately padded. No undue traction was placed on the right nonoperative arm. A spider arm positioner was used for this case.   A timeout was performed to verify the patient's name, date of birth, medical record number, correct site of surgery and correct procedure to be performed. It was also used to verify the patient had received antibiotics and that all appropriate instruments, implants and radiographic studies were available in the room. Once all in attendance were in agreement, the case began.   The patient had an examination under anesthesia. This revealed significant limitation of forward elevation to only approximately 110 degrees and abduction to approximately 90 degrees. The patient could externally rotate approximately 40 degrees and internally rotate to her body. The patient's shoulder was carefully manipulated under anesthesia. Once manipulation was performed, the patient could externally rotate to approximately 60 degrees with the arm adducted. She could forward elevate to approximately 160 degrees as well as abduct to  approximately 150 degrees after manipulation.   The patient was then prepped and draped in a sterile fashion. The bony landmarks were drawn out with a surgical marker along with the proposed arthroscopy incisions. These were pre- injected with 1% lidocaine plain. An 11 blade was used to make a posterior  portal. A hemostat was used to separate the fibers of the external rotators to allow  entry of the arthroscope into the glenohumeral joint from the posterior portal. The anterior portal was then established using an 18-gauge spinal needle under direct visualization. A 5.75 mm arthroscopic cannula was then placed through the anterior portal and a full diagnostic examination of the shoulder was undertaken.   Findings on arthroscopy included extensive synovitis and adhesions in the left shoulder. The patient was found to have a high-grade, partial-thickness tear of the supraspinatus. There was no focal chondral loss but the patient had fraying of the glenoid in the inferior aspect of the bony glenoid. The patient had some contracture of the inferior recess with significant synovitis. There was no anterior labral or posterior labral tear. The patient had a Buford complex. There was a partial tear of the middle of the cordlike middle glenohumeral ligament. The patient did not have a SLAP lesion. The biceps tendon was intact and the biceps anchor was stable.   A 90-degree ArthroCare wand was then placed through the anterior portal. An extensive partial synovectomy and lysis of adhesions was performed for this patient. A 0 PDS was then used to mark the supraspinatus tear so it could be identified from the bursal side. Once the glenohumeral joint was copiously irrigated, final images of the debridement were taken. The arthroscope was then placed into the subacromial space. The PDS suture was then identified. The partial-thickness rotator cuff tear was then completed from the bursal side. The greater tuberosity was then shaved with a 4-0 resector shaver blade to remove all remaining fibers of the rotator cuff from the greater tuberosity. Three ArthroCare Smart stitches were then placed in the lateral edge of the rotator cuff within the supraspinatus. A bursectomy was performed with a 90-degree ArthroCare wand along with a subacromial decompression with a 5.5 mm resector shaver blade. All arthroscopic  instruments were then removed.   A saber-type incision over the lateral acromion was made approximately 2 to 2.5 cm in length.  The deltoid fibers were then identified and split in line with the fibers. A self-retaining retractor was placed. The ArthroCare Smart stitches which had been placed in the lateral edge of the supraspinatus arthroscopically were then brought out through the deltoid split. The tear was easily identified. A single Magnum M anchor was then placed along the junction of the chondral bone interface of the humeral head at the greater tuberosity. The 4 limbs of these sutures were placed medially through the rotator cuff using a first-pass suture passer. They were then clamped with a hemostat for later repair. Three Magnum 2 anchors were then placed laterally after each 1 was loaded with 2 limbs of the Smart stitches which had been previously placed in the lateral border of the rotator cuff. These were tensioned to provide excellent repair and approximation to the greater tuberosity. Excellent coverage of the greater tuberosity was achieved. Once all 3 lateral anchors were placed, the medial sutures were tied down from the original Magnum M anchor.   The wound was then copiously irrigated. Final images of the repair through the mini open incision were taken with the arthroscope. The arthroscope was also placed back into the glenohumeral joint and arthroscopic images of the repair from the articular side were also taken. There was excellent approximation the rotator cuff to the greater tuberosity. All arthroscopic instruments were then removed. The incisions were copiously irrigated. The deltoid fascia was closed with a 0 interrupted Vicryl  suture, the subcutaneous tissue was closed with 2-0 Vicryl and the skin of the saber incision was closed with a running 4-0 Monocryl. The 3 arthroscopic portals, the anterior, medial and lateral portals were closed with 4-0 nylon. Steri-Strips were applied  along with Xeroform and a dry sterile dressing. TENS unit leads were placed above and below the shoulder along with a Polar Care sleeve and an abduction sling. The patient was then awakened and brought to the PACU in stable condition.   I was scrubbed and present for the entire case and all sharp and instrument counts were correct at the conclusion of the case. I spoke with the patient's husband in the postoperative consultation room to let him know the case had gone without complication and that his wife was stable in the recovery room.   The patient will follow up with me in 1 week for suture removal and to begin her physical therapy.    ____________________________ Timoteo Gaul, MD klk:cs D: 01/26/2013 18:10:24 ET T: 01/26/2013 18:26:37 ET JOB#: [id number]  cc: Timoteo Gaul, MD, <Dictator> Timoteo Gaul MD ELECTRONICALLY SIGNED 02/10/2013 16:55

## 2014-11-30 ENCOUNTER — Encounter: Payer: Self-pay | Admitting: Family Medicine

## 2014-11-30 ENCOUNTER — Ambulatory Visit (INDEPENDENT_AMBULATORY_CARE_PROVIDER_SITE_OTHER): Payer: BLUE CROSS/BLUE SHIELD | Admitting: Family Medicine

## 2014-11-30 VITALS — BP 110/66 | HR 64 | Temp 98.1°F | Ht 58.5 in | Wt 168.0 lb

## 2014-11-30 DIAGNOSIS — R42 Dizziness and giddiness: Secondary | ICD-10-CM | POA: Insufficient documentation

## 2014-11-30 DIAGNOSIS — S60562A Insect bite (nonvenomous) of left hand, initial encounter: Secondary | ICD-10-CM

## 2014-11-30 DIAGNOSIS — E559 Vitamin D deficiency, unspecified: Secondary | ICD-10-CM | POA: Insufficient documentation

## 2014-11-30 DIAGNOSIS — S39012A Strain of muscle, fascia and tendon of lower back, initial encounter: Secondary | ICD-10-CM

## 2014-11-30 DIAGNOSIS — F319 Bipolar disorder, unspecified: Secondary | ICD-10-CM | POA: Insufficient documentation

## 2014-11-30 DIAGNOSIS — R200 Anesthesia of skin: Secondary | ICD-10-CM | POA: Insufficient documentation

## 2014-11-30 DIAGNOSIS — Z720 Tobacco use: Secondary | ICD-10-CM | POA: Insufficient documentation

## 2014-11-30 DIAGNOSIS — G43909 Migraine, unspecified, not intractable, without status migrainosus: Secondary | ICD-10-CM | POA: Insufficient documentation

## 2014-11-30 DIAGNOSIS — K635 Polyp of colon: Secondary | ICD-10-CM | POA: Insufficient documentation

## 2014-11-30 DIAGNOSIS — T50905A Adverse effect of unspecified drugs, medicaments and biological substances, initial encounter: Secondary | ICD-10-CM | POA: Insufficient documentation

## 2014-11-30 DIAGNOSIS — Z8249 Family history of ischemic heart disease and other diseases of the circulatory system: Secondary | ICD-10-CM | POA: Insufficient documentation

## 2014-11-30 DIAGNOSIS — E010 Iodine-deficiency related diffuse (endemic) goiter: Secondary | ICD-10-CM | POA: Insufficient documentation

## 2014-11-30 DIAGNOSIS — K219 Gastro-esophageal reflux disease without esophagitis: Secondary | ICD-10-CM | POA: Insufficient documentation

## 2014-11-30 DIAGNOSIS — W57XXXA Bitten or stung by nonvenomous insect and other nonvenomous arthropods, initial encounter: Secondary | ICD-10-CM | POA: Diagnosis not present

## 2014-11-30 DIAGNOSIS — E038 Other specified hypothyroidism: Secondary | ICD-10-CM | POA: Insufficient documentation

## 2014-11-30 DIAGNOSIS — IMO0002 Reserved for concepts with insufficient information to code with codable children: Secondary | ICD-10-CM | POA: Insufficient documentation

## 2014-11-30 DIAGNOSIS — R87619 Unspecified abnormal cytological findings in specimens from cervix uteri: Secondary | ICD-10-CM | POA: Insufficient documentation

## 2014-11-30 DIAGNOSIS — N809 Endometriosis, unspecified: Secondary | ICD-10-CM | POA: Insufficient documentation

## 2014-11-30 DIAGNOSIS — F329 Major depressive disorder, single episode, unspecified: Secondary | ICD-10-CM | POA: Insufficient documentation

## 2014-11-30 DIAGNOSIS — F419 Anxiety disorder, unspecified: Secondary | ICD-10-CM

## 2014-11-30 DIAGNOSIS — R634 Abnormal weight loss: Secondary | ICD-10-CM | POA: Insufficient documentation

## 2014-11-30 DIAGNOSIS — E039 Hypothyroidism, unspecified: Secondary | ICD-10-CM | POA: Insufficient documentation

## 2014-11-30 DIAGNOSIS — G4700 Insomnia, unspecified: Secondary | ICD-10-CM | POA: Insufficient documentation

## 2014-11-30 NOTE — Patient Instructions (Signed)

## 2014-11-30 NOTE — Progress Notes (Signed)
Subjective:     Patient ID: Hannah [name], female   DOB: 10/01/1965, 49 y.o.   MRN: [id number]  Rash This is a new problem. The current episode started in the past 7 days. The problem has been gradually worsening since onset. The affected locations include the left hand. The rash is characterized by blistering, itchiness, pain, redness, swelling and draining. She was exposed to an insect bite/sting. Pertinent negatives include no congestion, cough, fatigue, fever, shortness of breath or vomiting. Past treatments include antihistamine. The treatment provided no relief.  Back Pain This is a new problem. The current episode started in the past 7 days. The problem occurs constantly. The problem has been gradually improving since onset. The pain is present in the lumbar spine and sacro-iliac. The quality of the pain is described as aching. The pain does not radiate. The pain is at a severity of 4/10. The pain is mild. The pain is worse during the day. The symptoms are aggravated by bending and sitting. Pertinent negatives include no abdominal pain, bladder incontinence, bowel incontinence, fever, headaches, leg pain, paresthesias, pelvic pain, tingling or weakness. She has tried analgesics, heat and ice for the symptoms. The treatment provided mild relief.    Subjective:      Review of Systems  Constitutional: Negative for fever, chills, diaphoresis, appetite change and fatigue.  HENT: Negative for congestion.   Respiratory: Negative for cough and shortness of breath.   Gastrointestinal: Negative for vomiting, abdominal pain and bowel incontinence.  Genitourinary: Negative for bladder incontinence and pelvic pain.  Musculoskeletal: Positive for back pain.  Skin: Positive for color change and rash.  Neurological: Negative for tingling, weakness, headaches and paresthesias.  All other systems reviewed and are negative.   Past Medical History  Diagnosis Date  . Migraine   . Allergy   . Asthma   .  Depression   . Anxiety   . GERD (gastroesophageal reflux disease)    Past Surgical History  Procedure Laterality Date  . Wisdom tooth extraction      x 4  . Tonsillectomy    . Cesarean section       x2  . Appendectomy  2004  . Abdominal hysterectomy  2004  . Tubal ligation    . Cervical conization w/bx  11/1995  . Laposcophy  05/1996    reports that she quit smoking about 23 years ago. Her smoking use included Cigarettes. She has a 9 pack-year smoking history. She has never used smokeless tobacco. She reports that she drinks alcohol. She reports that she does not use illicit drugs. family history includes Arthritis in her mother; Cancer in her brother; Depression in her mother and sister; Diabetes in her father and paternal grandmother; Heart disease in her mother; Hypertension in her mother; Kidney disease in her mother. She was adopted. Allergies  Allergen Reactions  . Cough-Cold Medicine  [Phenir-Pe-Sod Sal-Caff Cit] Itching  . Erythromycin   . Levofloxacin     headache, chest discomfort, insomnia  . Sulfonamide Derivatives   . Tetracyclines & Related   . Tramadol     Lips and mouth numbness.   History   Social History  . Marital Status: Married    Spouse Name: N/A  . Number of Children: N/A  . Years of Education: N/A   Occupational History  . Not on file.   Social History Main Topics  . Smoking status: Former Smoker -- 1.00 packs/day for 9 years    Types: Cigarettes  Quit date: 12/28/1990  . Smokeless tobacco: Never Used     Comment: quit 1992  . Alcohol Use: Yes     Comment: occasion  . Drug Use: No  . Sexual Activity:    Partners: Male    Birth Control/ Protection: Surgical   Other Topics Concern  . Not on file   Social History Narrative    Current Outpatient Prescriptions on File Prior to Visit  Medication Sig Dispense Refill  . Mometasone Furo-Formoterol Fum (DULERA IN) Inhale into the lungs.      . topiramate (TOPAMAX) 200 MG tablet Take 1  tablet (200 mg total) by mouth daily. 30 tablet 11  . eletriptan (RELPAX) 40 MG tablet One tablet by mouth at onset of headache. May repeat in 2 hours if headache persists or recurs. (Patient not taking: Reported on 11/30/2014) 12 tablet 11   No current facility-administered medications on file prior to visit.    Objective:   Physical Exam  Constitutional: She appears well-developed.  Cardiovascular: Normal rate and regular rhythm.   Musculoskeletal: She exhibits tenderness.  Palpable tenderness on right SI joint  Skin: Rash noted. There is erythema.  3 x 4 mm raised blister   BP 110/66 mmHg  Pulse 64  Temp(Src) 98.1 F (36.7 C) (Oral)  Ht 4' 10.5" (1.486 m)  Wt 168 lb (76.204 kg)  BMI 34.51 kg/m2  SpO2 98%    Assessment:  1. Low back strain, initial encounter  Take  Medication as needed. Be careful with activity. Printed out hand out.  Please call back if condition worsens or does not continue to improve.     2. Bug bite of hand, left, initial encounter Stable, improving. Please call if worsens or does not improve.      Plan:          Subjective:

## 2014-12-12 ENCOUNTER — Ambulatory Visit (INDEPENDENT_AMBULATORY_CARE_PROVIDER_SITE_OTHER): Payer: BLUE CROSS/BLUE SHIELD | Admitting: Psychiatry

## 2014-12-14 ENCOUNTER — Ambulatory Visit (INDEPENDENT_AMBULATORY_CARE_PROVIDER_SITE_OTHER): Payer: BLUE CROSS/BLUE SHIELD | Admitting: Psychiatry

## 2014-12-14 ENCOUNTER — Encounter: Payer: Self-pay | Admitting: Psychiatry

## 2014-12-14 VITALS — BP 118/64 | HR 68

## 2014-12-14 DIAGNOSIS — F331 Major depressive disorder, recurrent, moderate: Secondary | ICD-10-CM

## 2014-12-14 DIAGNOSIS — Z634 Disappearance and death of family member: Secondary | ICD-10-CM

## 2014-12-14 MED ORDER — BUSPIRONE HCL 10 MG PO TABS: 10.0000 mg | ORAL_TABLET | ORAL | Status: DC
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
## 2015-01-18 ENCOUNTER — Ambulatory Visit (INDEPENDENT_AMBULATORY_CARE_PROVIDER_SITE_OTHER): Payer: BLUE CROSS/BLUE SHIELD

## 2015-01-18 ENCOUNTER — Ambulatory Visit (INDEPENDENT_AMBULATORY_CARE_PROVIDER_SITE_OTHER): Payer: BLUE CROSS/BLUE SHIELD | Admitting: Podiatry

## 2015-01-18 VITALS — BP 137/86 | HR 65 | Resp 16

## 2015-01-18 DIAGNOSIS — M722 Plantar fascial fibromatosis: Secondary | ICD-10-CM

## 2015-01-18 MED ORDER — TRIAMCINOLONE ACETONIDE 10 MG/ML IJ SUSP
10.0000 mg | Freq: Once | INTRAMUSCULAR | Status: AC
  Administered 2015-01-18: 10 mg

## 2015-01-18 NOTE — Patient Instructions (Signed)
Plantar Fasciitis (Heel Spur Syndrome) with Rehab The plantar fascia is a fibrous, ligament-like, soft-tissue structure that spans the bottom of the foot. Plantar fasciitis is a condition that causes pain in the foot due to inflammation of the tissue. SYMPTOMS   Pain and tenderness on the underneath side of the foot.  Pain that worsens with standing or walking. CAUSES  Plantar fasciitis is caused by irritation and injury to the plantar fascia on the underneath side of the foot. Common mechanisms of injury include:  Direct trauma to bottom of the foot.  Damage to a small nerve that runs under the foot where the main fascia attaches to the heel bone.  Stress placed on the plantar fascia due to bone spurs. RISK INCREASES WITH:   Activities that place stress on the plantar fascia (running, jumping, pivoting, or cutting).  Poor strength and flexibility.  Improperly fitted shoes.  Tight calf muscles.  Flat feet.  Failure to warm-up properly before activity.  Obesity. PREVENTION  Warm up and stretch properly before activity.  Allow for adequate recovery between workouts.  Maintain physical fitness:  Strength, flexibility, and endurance.  Cardiovascular fitness.  Maintain a health body weight.  Avoid stress on the plantar fascia.  Wear properly fitted shoes, including arch supports for individuals who have flat feet. PROGNOSIS  If treated properly, then the symptoms of plantar fasciitis usually resolve without surgery. However, occasionally surgery is necessary. RELATED COMPLICATIONS   Recurrent symptoms that may result in a chronic condition.  Problems of the lower back that are caused by compensating for the injury, such as limping.  Pain or weakness of the foot during push-off following surgery.  Chronic inflammation, scarring, and partial or complete fascia tear, occurring more often from repeated injections. TREATMENT  Treatment initially involves the use of  ice and medication to help reduce pain and inflammation. The use of strengthening and stretching exercises may help reduce pain with activity, especially stretches of the Achilles tendon. These exercises may be performed at home or with a therapist. Your caregiver may recommend that you use heel cups of arch supports to help reduce stress on the plantar fascia. Occasionally, corticosteroid injections are given to reduce inflammation. If symptoms persist for greater than 6 months despite non-surgical (conservative), then surgery may be recommended.  MEDICATION   If pain medication is necessary, then nonsteroidal anti-inflammatory medications, such as aspirin and ibuprofen, or other minor pain relievers, such as acetaminophen, are often recommended.  Do not take pain medication within 7 days before surgery.  Prescription pain relievers may be given if deemed necessary by your caregiver. Use only as directed and only as much as you need.  Corticosteroid injections may be given by your caregiver. These injections should be reserved for the most serious cases, because they may only be given a certain number of times. HEAT AND COLD  Cold treatment (icing) relieves pain and reduces inflammation. Cold treatment should be applied for 10 to 15 minutes every 2 to 3 hours for inflammation and pain and immediately after any activity that aggravates your symptoms. Use ice packs or massage the area with a piece of ice (ice massage).  Heat treatment may be used prior to performing the stretching and strengthening activities prescribed by your caregiver, physical therapist, or athletic trainer. Use a heat pack or soak the injury in warm water. SEEK IMMEDIATE MEDICAL CARE IF:  Treatment seems to offer no benefit, or the condition worsens.  Any medications produce adverse side effects. EXERCISES RANGE   OF MOTION (ROM) AND STRETCHING EXERCISES - Plantar Fasciitis (Heel Spur Syndrome) These exercises may help you  when beginning to rehabilitate your injury. Your symptoms may resolve with or without further involvement from your physician, physical therapist or athletic trainer. While completing these exercises, remember:   Restoring tissue flexibility helps normal motion to return to the joints. This allows healthier, less painful movement and activity.  An effective stretch should be held for at least 30 seconds.  A stretch should never be painful. You should only feel a gentle lengthening or release in the stretched tissue. RANGE OF MOTION - Toe Extension, Flexion  Sit with your right / left leg crossed over your opposite knee.  Grasp your toes and gently pull them back toward the top of your foot. You should feel a stretch on the bottom of your toes and/or foot.  Hold this stretch for __________ seconds.  Now, gently pull your toes toward the bottom of your foot. You should feel a stretch on the top of your toes and or foot.  Hold this stretch for __________ seconds. Repeat __________ times. Complete this stretch __________ times per day.  RANGE OF MOTION - Ankle Dorsiflexion, Active Assisted  Remove shoes and sit on a chair that is preferably not on a carpeted surface.  Place right / left foot under knee. Extend your opposite leg for support.  Keeping your heel down, slide your right / left foot back toward the chair until you feel a stretch at your ankle or calf. If you do not feel a stretch, slide your bottom forward to the edge of the chair, while still keeping your heel down.  Hold this stretch for __________ seconds. Repeat __________ times. Complete this stretch __________ times per day.  STRETCH - Gastroc, Standing  Place hands on wall.  Extend right / left leg, keeping the front knee somewhat bent.  Slightly point your toes inward on your back foot.  Keeping your right / left heel on the floor and your knee straight, shift your weight toward the wall, not allowing your back to  arch.  You should feel a gentle stretch in the right / left calf. Hold this position for __________ seconds. Repeat __________ times. Complete this stretch __________ times per day. STRETCH - Soleus, Standing  Place hands on wall.  Extend right / left leg, keeping the other knee somewhat bent.  Slightly point your toes inward on your back foot.  Keep your right / left heel on the floor, bend your back knee, and slightly shift your weight over the back leg so that you feel a gentle stretch deep in your back calf.  Hold this position for __________ seconds. Repeat __________ times. Complete this stretch __________ times per day. STRETCH - Gastrocsoleus, Standing  Note: This exercise can place a lot of stress on your foot and ankle. Please complete this exercise only if specifically instructed by your caregiver.   Place the ball of your right / left foot on a step, keeping your other foot firmly on the same step.  Hold on to the wall or a rail for balance.  Slowly lift your other foot, allowing your body weight to press your heel down over the edge of the step.  You should feel a stretch in your right / left calf.  Hold this position for __________ seconds.  Repeat this exercise with a slight bend in your right / left knee. Repeat __________ times. Complete this stretch __________ times per day.    STRENGTHENING EXERCISES - Plantar Fasciitis (Heel Spur Syndrome)  These exercises may help you when beginning to rehabilitate your injury. They may resolve your symptoms with or without further involvement from your physician, physical therapist or athletic trainer. While completing these exercises, remember:   Muscles can gain both the endurance and the strength needed for everyday activities through controlled exercises.  Complete these exercises as instructed by your physician, physical therapist or athletic trainer. Progress the resistance and repetitions only as guided. STRENGTH -  Towel Curls  Sit in a chair positioned on a non-carpeted surface.  Place your foot on a towel, keeping your heel on the floor.  Pull the towel toward your heel by only curling your toes. Keep your heel on the floor.  If instructed by your physician, physical therapist or athletic trainer, add ____________________ at the end of the towel. Repeat __________ times. Complete this exercise __________ times per day. STRENGTH - Ankle Inversion  Secure one end of a rubber exercise band/tubing to a fixed object (table, pole). Loop the other end around your foot just before your toes.  Place your fists between your knees. This will focus your strengthening at your ankle.  Slowly, pull your big toe up and in, making sure the band/tubing is positioned to resist the entire motion.  Hold this position for __________ seconds.  Have your muscles resist the band/tubing as it slowly pulls your foot back to the starting position. Repeat __________ times. Complete this exercises __________ times per day.  Document Released: 06/16/2005 Document Revised: 09/08/2011 Document Reviewed: 09/28/2008 [employer] Patient Information 2015 [employer]. This information is not intended to replace advice given to you by your health care provider. Make sure you discuss any questions you have with your health care provider.  

## 2015-01-18 NOTE — Progress Notes (Signed)
Subjective:     Patient ID: Hannah [name], female   DOB: Oct 03, 1965, 49 y.o.   MRN: [id number]  HPI 49 year old female presents the office today for complaint of left heel pain which has been ongoing for several months. She states that she has pain mostly in the morning which is relieved by ambulation. She states that she has purchased new inserts and shoes which do not seem to help. She is also currently taking meloxicam for other issues on a daily basis which does not seem to help either. Icing does seem to help. She denies any history of injury or trauma to the area and denies any change or increase activity this time off of the symptoms. Denies any swelling or redness. Denies any numbness or tingling. The pain does not wake her up at night. No other complaints at this time.  Review of Systems  All other systems reviewed and are negative.      Objective:   Physical Exam AAO x3, NAD DP/PT pulses palpable bilaterally, CRT less than 3 seconds Protective sensation intact with Simms Weinstein monofilament, vibratory sensation intact, Achilles tendon reflex intact Tenderness to palpation overlying the plantar medial tubercle of the calcaneus to left heel at the insertion of the plantar fascia. There is no pain along the course of plantar fascia within the arch of the foot. There is no pain with lateral compression of the calcaneus or pain the vibratory sensation. No pain on the posterior aspect of the calcaneus or along the course/insertion of the Achilles tendon. There is no overlying edema, erythema, increase in warmth. No other areas of tenderness palpation or pain with vibratory sensation to the foot/ankle. MMT 5/5, ROM WNL No open lesions or pre-ulcerative lesions are identified. No pain with calf compression, swelling, warmth, erythema.     Assessment:     49 year old female with left heel pain, plantar fasciitis    Plan:     -Treatment options discussed including all alternatives,  risks, and complications -X-rays were obtained and reviewed with the patient.  Patient elects to proceed with steroid injection into the left heel. Under sterile skin preparation, a total of 2.5cc of kenalog 10, 0.5% Marcaine plain, and 2% lidocaine plain were infiltrated into the symptomatic area without complication. A band-aid was applied. Patient tolerated the injection well without complication. Post-injection care with discussed with the patient. Discussed with the patient to ice the area over the next couple of days to help prevent a steroid flare.  -Dispensed night splint -Continue stretching activities on a consistent basis. Icing the area daily. -Continue supportive shoe gear all times. Not to go barefoot at home. Continue orthotics. -Follow-up 3 months or sooner if any problems arise. In the meantime, encouraged to call the office with any questions, concerns, change in symptoms.   Ovid Curd, DPM

## 2015-02-03 ENCOUNTER — Other Ambulatory Visit: Payer: Self-pay | Admitting: Psychiatry

## 2015-02-07 ENCOUNTER — Other Ambulatory Visit: Payer: Self-pay

## 2015-02-07 NOTE — Telephone Encounter (Signed)
called request a refill on buspirone, pt next appt is 02-15-15 pt will not have enough medication to last until appt.

## 2015-02-08 ENCOUNTER — Encounter: Payer: Self-pay | Admitting: Podiatry

## 2015-02-08 ENCOUNTER — Ambulatory Visit (INDEPENDENT_AMBULATORY_CARE_PROVIDER_SITE_OTHER): Payer: BLUE CROSS/BLUE SHIELD | Admitting: Podiatry

## 2015-02-08 DIAGNOSIS — M779 Enthesopathy, unspecified: Secondary | ICD-10-CM | POA: Diagnosis not present

## 2015-02-08 DIAGNOSIS — M722 Plantar fascial fibromatosis: Secondary | ICD-10-CM | POA: Diagnosis not present

## 2015-02-08 MED ORDER — TRIAMCINOLONE ACETONIDE 10 MG/ML IJ SUSP
10.0000 mg | Freq: Once | INTRAMUSCULAR | Status: AC
  Administered 2015-02-08: 10 mg

## 2015-02-08 NOTE — Progress Notes (Signed)
Patient ID: Hannah [name], female   DOB: Aug 24, 1965, 49 y.o.   MRN: [id number]  Subjective: 49 year old female presents the office they for follow-up evaluation of left heel pain, plantar fasciitis. She states that her last appointment she had was complete resolution of symptoms for approximate 5 days for the pain started to recur. She states that she has continue the stretching icing activities daily. She states that she started having more pain at the end of the day of admission been on her feet for quite some time. She also has some pain if she walks on uneven ground. She denies any numbness or tingling and denies any swelling or redness. No injury or trauma. No other complaints at this time.  Objective: AAO x3, NAD DP/PT pulses palpable bilaterally, CRT less than 3 seconds Protective sensation intact with Simms Weinstein monofilament, vibratory sensation intact, Achilles tendon reflex intact There is continued tenderness to palpation overlying the plantar medial tubercle of the calcaneus to left heel at the insertion of the plantar fascia, although she does state it has decreased. There is no pain along the course of plantar fascia within the arch of the foot. Plantar fascia appears intact. There is no pain with lateral compression of the calcaneus or pain the vibratory sensation. No pain on the posterior aspect of the calcaneus or along the course/insertion of the Achilles tendon. There is mild tenderness along the lateral aspect of the foot along the sinus tarsi of the left. There is no overlying edema, erythema, increase in warmth. No other areas of tenderness palpation or pain with vibratory sensation to the foot/ankle. MMT 5/5, ROM WNL No open lesions or pre-ulcerative lesions are identified. No pain with calf compression, swelling, warmth, erythema.  Assessment: 49 year old female with continued left heel pain although somewhat improved  Plan: -Treatment options discussed including all  alternatives, risks, and complications Patient elects to proceed with steroid injection into the left heel. Under sterile skin preparation, a total of 2.5cc of kenalog 10, 0.5% Marcaine plain, and 2% lidocaine plain were infiltrated into the symptomatic area without complication. A band-aid was applied. Patient tolerated the injection well without complication. Post-injection care with discussed with the patient. Discussed with the patient to ice the area over the next couple of days to help prevent a steroid flare.  -Dispensed plantar fascial brace. -Discussed orthotics and she would like to pursue custom orthotics. Today's appointment she was casted by Fenton Foy and they were sent to [facility] labs.  -Continue stretching and icing activities. -Continue shoe gear changes. -Follow-up 3-4 weeks or sooner if any problems arise. In the meantime, encouraged to call the office with any questions, concerns, change in symptoms.   Ovid Curd, DPM

## 2015-02-13 ENCOUNTER — Telehealth: Payer: Self-pay | Admitting: Physician Assistant

## 2015-02-13 NOTE — Telephone Encounter (Signed)
Called pt, L/M that pt will need to make an appt for any further refills, last noted visit in office was Oct. 2013.

## 2015-02-13 NOTE — Telephone Encounter (Signed)
Pt called stating that the pharmacy has faxed over several requests for a refill on her Topamax. Pt says she's been out for a week now and she's starting to get headaches. Pt uses the [facility] in [place] on [address].

## 2015-02-15 ENCOUNTER — Ambulatory Visit: Payer: Self-pay | Admitting: Psychiatry

## 2015-02-15 ENCOUNTER — Telehealth: Payer: Self-pay | Admitting: *Deleted

## 2015-02-15 NOTE — Telephone Encounter (Signed)
Spoke to pt about Topamax refill, informed her that [facility] was the the new headache specialists and would not approve any medications until the pt has been seen by her in the office, also informed pt that she has not been seen in our office since Oct 2013 and needed a follow-up.  Pt stated that she didn't understand why they refilled her medication last year and did not make her make an appt at that time.  Apologized for the confusion and recommended to go to Urgent Care for medical attention if HA continue to get worse until she can be seen here in the office.  Pt acknowledged.

## 2015-02-15 NOTE — Telephone Encounter (Signed)
Pt called asking if she could be given a rx for her Topamax to last until her appt on 02/20/15 @ 2:45p. Please advise.

## 2015-02-20 ENCOUNTER — Ambulatory Visit (INDEPENDENT_AMBULATORY_CARE_PROVIDER_SITE_OTHER): Payer: BLUE CROSS/BLUE SHIELD | Admitting: Physician Assistant

## 2015-02-20 ENCOUNTER — Encounter: Payer: Self-pay | Admitting: Physician Assistant

## 2015-02-20 VITALS — BP 115/75 | HR 65 | Resp 16 | Ht 59.5 in | Wt 167.0 lb

## 2015-02-20 DIAGNOSIS — G43911 Migraine, unspecified, intractable, with status migrainosus: Secondary | ICD-10-CM | POA: Diagnosis not present

## 2015-02-20 DIAGNOSIS — G43009 Migraine without aura, not intractable, without status migrainosus: Secondary | ICD-10-CM | POA: Diagnosis not present

## 2015-02-20 MED ORDER — TOPIRAMATE 200 MG PO TABS: 200.0000 mg | ORAL_TABLET | Freq: Every day | ORAL | Status: DC

## 2015-02-20 MED ORDER — TOPIRAMATE 50 MG PO TABS: 50.0000 mg | ORAL_TABLET | ORAL | Status: DC

## 2015-02-20 MED ORDER — BUSPIRONE HCL 10 MG PO TABS: 10.0000 mg | ORAL_TABLET | ORAL | Status: DC

## 2015-02-20 NOTE — Progress Notes (Signed)
Patient ID: Hannah [name], female   DOB: [birth year], 49 y.o.   MRN: [id number] History:  Hannah [name] is a 49 y.o. G3P2 who presents to clinic today for headache management.  She is new to this provider and saw last HA provider nearly 3 years ago.   HA's are moderate to severe, bilateral in lower occiput, last up to all day.  She has throbbing, worse with movement, phonophobia but no photophobia.  She routinely nausea and rarely vomiting.  She has no warning signs or vision changes.   She treats acute HA with Excedrin and [facility].    She tried Wellbutrin for prevention some time ago but this did not work well for her - caused numbness of hands while driving.   The Topamax was helpful for her before at  and that was working really well.    HIT6:50 Number of days in the last 4 weeks with:  Severe headache: 0 Moderate headache: 4 Mild headache: 8  No headache: 16   Past Medical History  Diagnosis Date  . Migraine   . Allergy   . Asthma   . Depression   . Anxiety   . GERD (gastroesophageal reflux disease)     Social History   Social History  . Marital Status: Married    Spouse Name: N/A  . Number of Children: N/A  . Years of Education: N/A   Occupational History  . Not on file.   Social History Main Topics  . Smoking status: Former Smoker -- 1.00 packs/day for 9 years    Types: Cigarettes    Quit date: 12/28/1990  . Smokeless tobacco: Never Used     Comment: quit 1992  . Alcohol Use: Yes     Comment: occasion  . Drug Use: No  . Sexual Activity:    Partners: Male    Birth Control/ Protection: Surgical   Other Topics Concern  . Not on file   Social History Narrative    Family History  Problem Relation Age of Onset  . Adopted: Yes  . Arthritis Mother   . Depression Mother   . Hypertension Mother   . Heart disease Mother     CHF  . Kidney disease Mother   . Diabetes Father   . Depression Sister   . Cancer Brother     colon, lungs, liver  .  Diabetes Paternal Grandmother     Allergies  Allergen Reactions  . Cough-Cold Medicine  [Phenir-Pe-Sod Sal-Caff Cit] Itching  . Erythromycin   . Levofloxacin     headache, chest discomfort, insomnia  . Sulfonamide Derivatives   . Sulfur   . Tetracyclines & Related   . Tramadol     Lips and mouth numbness.    Current Outpatient Prescriptions on File Prior to Visit  Medication Sig Dispense Refill  . Beclomethasone Dipropionate 80 MCG/ACT AERS Place into the nose.    . busPIRone (BUSPAR) 10 MG tablet Take 1 tablet (10 mg total) by mouth every morning. 30 tablet 1  . eletriptan (RELPAX) 40 MG tablet One tablet by mouth at onset of headache. May repeat in 2 hours if headache persists or recurs. 12 tablet 11  . fexofenadine (ALLEGRA) 180 MG tablet Take by mouth.    . levothyroxine (SYNTHROID, LEVOTHROID) 100 MCG tablet Take 100 mcg by mouth daily before breakfast.    . meloxicam (MOBIC) 15 MG tablet Take 7.5 mg by mouth daily.     . Mometasone Furo-Formoterol Fum (DULERA IN)  Inhale into the lungs.      . montelukast (SINGULAIR) 10 MG tablet Take by mouth.    . Ospemifene 60 MG TABS     . PROAIR HFA 108 (90 BASE) MCG/ACT inhaler INHALE 2 PUFFS AS NEEDED EVERY 4 - 6 HOURS FOR COUGH /WHEEZING  0  . topiramate (TOPAMAX) 200 MG tablet Take 200 mg by mouth 2 (two) times daily.    . Vitamin D, Ergocalciferol, (DRISDOL) 50000 UNITS CAPS capsule Take 50,000 Units by mouth every 7 (seven) days.    Marland Kitchen topiramate (TOPAMAX) 200 MG tablet Take 1 tablet (200 mg total) by mouth daily. 30 tablet 11   No current facility-administered medications on file prior to visit.     Review of Systems:  All pertinent positive/negative included in HPI, all other review of systems are negative  Objective:  Physical Exam BP 115/75 mmHg  Pulse 65  Resp 16  Ht 4' 11.5" (1.511 m)  Wt 167 lb (75.751 kg)  BMI 33.18 kg/m2 CONSTITUTIONAL: Well-developed, well-nourished female in no acute distress.  EYES: EOM  intact ENT: Normocephalic CARDIOVASCULAR: Regular rate and rhythm with no adventitious sounds.  RESPIRATORY: Normal rate. Clear to auscultation bilaterally.  ENDOCRINE: Normal thyroid.  MUSCULOSKELETAL: Normal ROM, strength equal bilaterally, significant muscle spasm noted - worse in trapezius than cervical paraspinal muscles SKIN: Warm, dry without erythema  NEUROLOGICAL: Alert, oriented, CN II-XII grossly intact, Appropriate balance, No dysmetria, Sensation equal bilaterally, Romberg negative.   PSYCH: Normal behavior, mood   Assessment & Plan:  Assessment: Migraine without aura  Plan: Topamax - restart.  Begin with 50mg  qhs x 1 week, then titrate up weekly to 200mg  qhs.   May use Excedrin for acute pain but limit to 2 days/week Continue with PCP for management of other issues   Follow-up in 12 months or sooner PRN  Bertram Denver, PA-C 02/20/2015 2:17 PM

## 2015-02-27 ENCOUNTER — Ambulatory Visit (INDEPENDENT_AMBULATORY_CARE_PROVIDER_SITE_OTHER): Payer: BLUE CROSS/BLUE SHIELD | Admitting: Psychiatry

## 2015-02-27 ENCOUNTER — Encounter: Payer: Self-pay | Admitting: Psychiatry

## 2015-02-27 VITALS — BP 124/82 | HR 75 | Temp 98.4°F | Ht 59.0 in | Wt 170.2 lb

## 2015-02-27 DIAGNOSIS — F331 Major depressive disorder, recurrent, moderate: Secondary | ICD-10-CM | POA: Diagnosis not present

## 2015-02-27 MED ORDER — BUSPIRONE HCL 10 MG PO TABS: 10.0000 mg | ORAL_TABLET | ORAL | Status: DC

## 2015-02-27 NOTE — Progress Notes (Signed)
BH MD/PA/NP OP Progress Note  02/27/2015 2:34 PM Hannah [name]  MRN:  [id number]  Subjective:    Patient is a 49 year old female who presented for the follow-up appointment. Patient reported that she is still helping her sister and she is not improving. She  has started taking BuSpar  which has been helping her. She continues to have problems with sleep and she is unable to sleep in her bed and she is doing some relaxation techniques for the same. She stated that she uses Singulair and Allegra at night for allergies. She has been exercising on a regular basis. She denied having any adverse effects of the medications. She denied having any suicidal ideations or plans.  Chief Complaint:  Chief Complaint    Follow-up; Medication Refill; Depression     Visit Diagnosis:   No diagnosis found.  Past Medical History:  Past Medical History  Diagnosis Date  . Migraine   . Allergy   . Asthma   . Depression   . Anxiety   . GERD (gastroesophageal reflux disease)     Past Surgical History  Procedure Laterality Date  . Wisdom tooth extraction      x 4  . Tonsillectomy    . Cesarean section       x2  . Appendectomy  2004  . Abdominal hysterectomy  2004  . Tubal ligation    . Cervical conization w/bx  11/1995  . Laposcophy  05/1996   Family History:  Family History  Problem Relation Age of Onset  . Adopted: Yes  . Arthritis Mother   . Depression Mother   . Hypertension Mother   . Heart disease Mother     CHF  . Kidney disease Mother   . Diabetes Father   . Depression Sister   . Cancer Brother     colon, lungs, liver  . Diabetes Paternal Grandmother    Social History:  Social History   Social History  . Marital Status: Married    Spouse Name: N/A  . Number of Children: N/A  . Years of Education: N/A   Social History Main Topics  . Smoking status: Former Smoker -- 1.00 packs/day for 9 years    Types: Cigarettes    Quit date: 12/28/1990  . Smokeless tobacco: Never  Used     Comment: quit 1992  . Alcohol Use: No     Comment: occasion  . Drug Use: No  . Sexual Activity:    Partners: Male    Birth Control/ Protection: Surgical   Other Topics Concern  . None   Social History Narrative   Additional History:  Currently lives with her husband. She appeared to be involved in her care.   Assessment:   Musculoskeletal: Strength & Muscle Tone: within normal limits Gait & Station: normal Patient leans: N/A  Psychiatric Specialty Exam: Depression        Associated symptoms include headaches.   Review of Systems  Constitutional: Negative for chills and malaise/fatigue.  HENT: Negative for nosebleeds.   Eyes: Negative for pain.  Respiratory: Negative for hemoptysis.   Cardiovascular: Negative for palpitations and PND.  Gastrointestinal: Negative for vomiting.  Genitourinary: Negative for urgency.  Musculoskeletal: Positive for back pain.  Neurological: Positive for headaches. Negative for tingling and speech change.  Endo/Heme/Allergies: Negative for environmental allergies.  Psychiatric/Behavioral: Negative for depression. The patient is nervous/anxious.   All other systems reviewed and are negative.   Blood pressure 124/82, pulse 75, temperature 98.4 F (36.9 C),  temperature source Tympanic, height 4\' 11"  (1.499 m), weight 170 lb 3.2 oz (77.202 kg), SpO2 99 %.Body mass index is 34.36 kg/(m^2).  General Appearance: Casual  Eye Contact:  Fair  Speech:  Normal Rate  Volume:  Normal  Mood:  Anxious  Affect:  Congruent  Thought Process:  Goal Directed  Orientation:  Full (Time, Place, and Person)  Thought Content:  WDL  Suicidal Thoughts:  No  Homicidal Thoughts:  No  Memory:  Immediate;   Fair  Judgement:  Fair  Insight:  Fair  Psychomotor Activity:  Normal  Concentration:  Fair  Recall:  [employer] of Knowledge: Fair  Language: Fair  Akathisia:  No  Handed:  Right  AIMS (if indicated):  none  Assets:  Communication  Skills Desire for Improvement Physical Health Social Support  ADL's:  Intact  Cognition: WNL  Sleep:  6-7   Is the patient at risk to self?  No. Has the patient been a risk to self in the past 6 months?  No. Has the patient been a risk to self within the distant past?  No. Is the patient a risk to others?  No. Has the patient been a risk to others in the past 6 months?  No. Has the patient been a risk to others within the distant past?  No.  Current Medications: Current Outpatient Prescriptions  Medication Sig Dispense Refill  . Beclomethasone Dipropionate 80 MCG/ACT AERS Place into the nose.    . busPIRone (BUSPAR) 10 MG tablet TAKE 1 TABLET (10 MG TOTAL) BY MOUTH EVERY MORNING. 30 tablet 1  . busPIRone (BUSPAR) 10 MG tablet Take 1 tablet (10 mg total) by mouth every morning. 30 tablet 1  . eletriptan (RELPAX) 40 MG tablet One tablet by mouth at onset of headache. May repeat in 2 hours if headache persists or recurs. (Patient not taking: Reported on 02/20/2015) 12 tablet 11  . fexofenadine (ALLEGRA) 180 MG tablet Take by mouth.    . levothyroxine (SYNTHROID, LEVOTHROID) 100 MCG tablet Take 100 mcg by mouth daily before breakfast.    . meloxicam (MOBIC) 15 MG tablet Take 7.5 mg by mouth daily.     . meloxicam (MOBIC) 7.5 MG tablet TAKE 1 TABLET BY MOUTH ONCE DAILY.    Marland Kitchen Mometasone Furo-Formoterol Fum (DULERA IN) Inhale into the lungs.      . montelukast (SINGULAIR) 10 MG tablet Take by mouth.    . Ospemifene 60 MG TABS     . PROAIR HFA 108 (90 BASE) MCG/ACT inhaler INHALE 2 PUFFS AS NEEDED EVERY 4 - 6 HOURS FOR COUGH /WHEEZING  0  . topiramate (TOPAMAX) 200 MG tablet Take 200 mg by mouth 2 (two) times daily.    Marland Kitchen topiramate (TOPAMAX) 200 MG tablet Take 1 tablet (200 mg total) by mouth daily. 30 tablet 11  . topiramate (TOPAMAX) 50 MG tablet Take 1 tablet (50 mg total) by mouth PRO. One at night x 1 week, then 2 at night x 1 week, then 3 at night x 1 week, then 4 at night until new rx.  120 tablet 0  . Vitamin D, Ergocalciferol, (DRISDOL) 50000 UNITS CAPS capsule Take 50,000 Units by mouth every 7 (seven) days.     No current facility-administered medications for this visit.    Medical Decision Making:  Established Problem, Stable/Improving (1) and Review of Last Therapy Session (1)  Treatment Plan Summary:Medication management   Discussed with patient what the medications treatment risk benefits and alternatives.  She will continue on buspirone 10 mg in the morning. Advised patient to call for help if she notices worsening of her symptoms and she demonstrated understanding.   More than 50% of the time spent in psychoeducation, counseling and coordination of care.    This note was generated in part or whole with voice recognition software. Voice regonition is usually quite accurate but there are transcription errors that can and very often do occur. I apologize for any typographical errors that were not detected and corrected.    Brandy Hale 02/27/2015, 2:34 PM

## 2015-03-08 ENCOUNTER — Encounter: Payer: Self-pay | Admitting: Podiatry

## 2015-03-08 ENCOUNTER — Ambulatory Visit (INDEPENDENT_AMBULATORY_CARE_PROVIDER_SITE_OTHER): Payer: BLUE CROSS/BLUE SHIELD | Admitting: Podiatry

## 2015-03-08 VITALS — BP 112/69 | HR 67 | Resp 18

## 2015-03-08 DIAGNOSIS — M722 Plantar fascial fibromatosis: Secondary | ICD-10-CM | POA: Diagnosis not present

## 2015-03-08 MED ORDER — MELOXICAM 15 MG PO TABS: 15.0000 mg | ORAL_TABLET | Freq: Every day | ORAL | Status: DC

## 2015-03-08 NOTE — Patient Instructions (Signed)

## 2015-03-08 NOTE — Progress Notes (Signed)
Patient ID: Hannah [name], female   DOB: [birth year], 49 y.o.   MRN: [id number]  Subjective: Patient presents to the office today for continued care of left heel pain, plantar fasciitis and pickup orthotics. She states that overall she is been doing well with minimal pain to her heel up until last day or so. She states that she discontinue stretching, wearing the night splint and I seen and she started having recurrence of pain. She denies any swelling or redness. No tingling or numbness. No other complaints at this time. No acute changes since last appointment.  Objective: AAO x3, NAD DP/PT pulses palpable bilaterally, CRT less than 3 seconds Protective sensation intact with Simms Weinstein monofilament, vibratory sensation intact, Achilles tendon reflex intact There is mild tenderness to palpation overlying the plantar medial tubercle of the calcaneus to the left heel at the insertion of the plantar fascia. There is no pain along the course of plantar fascial within the arch of the foot. There is no pain with lateral compression of the calcaneus or pain the vibratory sensation. No pain on the posterior aspect of the calcaneus or along the course/insertion of the Achilles tendon. There is no overlying edema, erythema, increase in warmth. No other areas of tenderness palpation or pain with vibratory sensation to the foot/ankle. MMT 5/5, ROM WNL No open lesions or pre-ulcerative lesions are identified. No pain with calf compression, swelling, warmth, erythema.  Assessment: 49 year old female left heel pain, plantar fasciitis  Plan: -Treatment options discussed including all alternatives, risks, and complications -Discussed steroid injection however she wishes to hold off on that at this time. -Orthotics were dispensed. Break instructions both oral and written were provided. -Continue stretching, icing on a regular basis. -Anti-inflammatories needed -Continue with supportive shoe gear. -Continue  night splint -Follow-up in 4 weeks or sooner if any problems arise. In the meantime, encouraged to call the office with any questions, concerns, change in symptoms.   Ovid Curd, DPM

## 2015-03-20 ENCOUNTER — Other Ambulatory Visit: Payer: Self-pay | Admitting: Physician Assistant

## 2015-03-20 ENCOUNTER — Telehealth: Payer: Self-pay

## 2015-03-20 NOTE — Telephone Encounter (Signed)
Patient called saying that yesterday she developed a sore throat w/ cough and today it has gotten worse. Patient reports that she has a lot of congestion in her head along with PND. Patient denies having any fever. She does have history of asthma and allergies. Patient reports that she takes her allergy/asthma meds on a daily basis. She has not had to use her inhaler since she has been sick. She has not been taking anything OTC for symptoms, just an occasional cough drop. Patient is requesting that something be called into the pharmacy for symptoms. Patient reports that she can not come in due to the limited gas that she has and she lives in [place]. Patient uses [facility] on [address]. Patient's contact info is correct.

## 2015-03-20 NOTE — Telephone Encounter (Signed)
Please clarify what she thinks would help?  Is gas at [facility] if needs to be seen.  Thanks.

## 2015-03-20 NOTE — Telephone Encounter (Signed)
Pt reports her breathing is fine right now, and denies fever.  She does not think its time yet for prednisone and she does have an inhaler and cough syrup on hand in case she needs it.  I explained the symptoms of a sinus infection (i.e. Symptoms start to improve the suddenly come back, cough, pain, pressure, blowing out color.)  I also advised her to call back if her asthma starts to flare or if she starts running fevers.  She agreed.    Thanks,   -Vernona Rieger

## 2015-04-05 ENCOUNTER — Ambulatory Visit (INDEPENDENT_AMBULATORY_CARE_PROVIDER_SITE_OTHER): Payer: BLUE CROSS/BLUE SHIELD | Admitting: Podiatry

## 2015-04-05 ENCOUNTER — Encounter: Payer: Self-pay | Admitting: Podiatry

## 2015-04-05 VITALS — BP 105/61 | HR 73 | Resp 18

## 2015-04-05 DIAGNOSIS — M722 Plantar fascial fibromatosis: Secondary | ICD-10-CM

## 2015-04-05 NOTE — Progress Notes (Signed)
Patient ID: SHERIDAN HEW, female   DOB: April 28, 1966, 49 y.o.   MRN: [id number]  Subjective: 49 year old female presents the office they for follow-up evaluation of left heel pain. She states that she continues get in the bottom of her left heel. She states that the pain does get better when she wears a night splint. If she goes without the splint she has increase in pain. She does continue to get throbbing pain on her heel on a consistent basis. She has been wearing orthotics which appearing fitting well however she has not noticed much improvement in her heel pain since the orthotics. She does stretch daily but does not ice. She does not wear the orthotics all the time which has to with them and she is very active. Denies any recent injury or trauma. No swelling or redness. No tingling or numbness. No other complaints at this time.  Objective: AAO 3, NAD Neurovascular status intact and unchanged There is continuation of tenderness to palpation on the plantar medial tubercle of the calcaneus or the insertion of the plantar fascia on the left foot. There is no pain along the course of plantar fascial in the arch of the foot. There is no pain lateral compression. No other areas of tenderness to bilateral lower extremities. No open lesions or pre-ulcerative lesions. No pain with calf compression, swelling, warmth, erythema.  Assessment: 49 year old female with continuation of left heel pain, likely plantar fasciitis  Plan: -Treatment options discussed including all alternatives, risks, and complications -At this time she has had multiple conservative treatments without much relief I discussed with her physical therapy. She will to pursue this. We'll order physical therapy for [facility]. -Continue stretching icing activities daily. -Continue orthotics and supportive shoe gear. -Follow-up after PT or sooner if any problems arise. In the meantime, encouraged to call the office with any questions, concerns,  change in symptoms.   Ovid Curd, DPM

## 2015-04-06 ENCOUNTER — Telehealth: Payer: Self-pay | Admitting: *Deleted

## 2015-04-06 DIAGNOSIS — M722 Plantar fascial fibromatosis: Secondary | ICD-10-CM

## 2015-04-06 NOTE — Telephone Encounter (Signed)
-----   Message from Vivi Barrack, DPM sent at 04/05/2015  3:37 PM EDT ----- Can you please place an order for [facility] physical therapy dx: plantar fasciitis. Thanks.

## 2015-04-06 NOTE — Telephone Encounter (Signed)
Dr. Ardelle Anton ordered PT for left foot plantar fasciitis, focusing on flexibility and decrease in pain.

## 2015-04-24 ENCOUNTER — Ambulatory Visit: Payer: BLUE CROSS/BLUE SHIELD | Attending: Podiatry | Admitting: Physical Therapy

## 2015-04-24 DIAGNOSIS — M79672 Pain in left foot: Secondary | ICD-10-CM | POA: Diagnosis present

## 2015-04-24 NOTE — Therapy (Signed)
[facility] MAIN [facility] SERVICES [address] [place], [place], [address] Phone: [phone]   Fax:  [phone]  Physical Therapy Evaluation  Patient Details  Name: Hannah [name] MRN: [id number] Date of Birth: [birth year] Referring Provider: Loreta Ave  Encounter Date: 04/24/2015      PT End of Session - 04/24/15 1705    Visit Number 1   Number of Visits 17   PT Start Time 0430   PT Stop Time 0515   PT Time Calculation (min) 45 min      Past Medical History  Diagnosis Date  . Migraine   . Allergy   . Asthma   . Depression   . Anxiety   . GERD (gastroesophageal reflux disease)     Past Surgical History  Procedure Laterality Date  . Wisdom tooth extraction      x 4  . Tonsillectomy    . Cesarean section       x2  . Appendectomy  2004  . Abdominal hysterectomy  2004  . Tubal ligation    . Cervical conization w/bx  11/1995  . Laposcophy  05/1996    There were no vitals filed for this visit.  Visit Diagnosis:  Left foot pain      Subjective Assessment - 04/24/15 1633    Subjective L foot hurt in flats, but felt better in wedges. Foot doctor said to take 2 aleve in the AM and PM and to get better shoes, but nothing changed. Got steriod shots and only feels better for 4-5 days, last one being about a month ago. Sleeps in boot in order to walk better in the AM, been wearing it for about 2 months. Foot brace did not help at all. Dr diagnosed with plantar fascitis and bone spur. Problems have been going on for about 9 months.            [facility] PT Assessment - 04/24/15 0001    Assessment   Medical Diagnosis plantar fascitis   Referring Provider Loreta Ave   Onset Date/Surgical Date 07/31/14   Hand Dominance Right   Prior Therapy yes  shoulder   Balance Screen   Has the patient had a decrease in activity level because of a fear of falling?  Yes   Is the patient reluctant to leave their home because of a fear of falling?  No   Home Environment   Living Environment Private residence   Prior Function   Level of Independence Independent    Patient reports not being able to walk or stand or complete her required tasks for her home due to pain in her left foot.  PAIN: 7-8/10 on worst and 3/10 on best days; current is 3/10  STRENGTH:  Graded on a 0-5 scale Muscle Group Left Right  Shoulder flex    Shoulder Abd    Shoulder Ext    Shoulder IR/ER    Elbow    Wrist/hand    Hip Flex 5 5  Hip Abd 5 5  Hip Add 5 5  Hip Ext 5 5  Hip IR/ER 5 5  Knee Flex 5 5  Knee Ext 5 5  Ankle DF 5 5  Ankle PF 5 5    OUTCOME MEASURES: TEST Outcome Interpretation  LEFS 66/80 80/80 is within normal limits                         PT Education - 04/24/15 1704  Education provided Yes   Education Details Ice and HEP   Person(s) Educated Patient   Methods Explanation   Comprehension Verbalized understanding             PT Long Term Goals - 04/24/15 1709    PT LONG TERM GOAL #1   Title Patient will increase lower extremity functional scale to >60/80 to demonstrate improved functional mobility and increased tolerance with ADLs.    Time 4   Period Weeks   PT LONG TERM GOAL #2   Title Patient will report a worst pain of 3/10 on VAS in    left foot         to improve tolerance with ADLs and reduced symptoms with activities   Time 4   Period Weeks   PT LONG TERM GOAL #3   Title Patient will be independent with HEP to stretch and ice her left foot to decreaase pain and return to her PLOFL    Time 4   Period Weeks               Plan - 04/24/15 1706    Clinical Impression Statement Patient has left foot pain that is constant and limits her ability to walk and stand. She has tenderness to left heel.    Pt will benefit from skilled therapeutic intervention in order to improve on the following deficits Decreased activity tolerance;Impaired flexibility;Pain;Difficulty walking   Rehab Potential  Good   PT Frequency 2x / week   PT Duration 4 weeks   PT Treatment/Interventions Cryotherapy;Electrical Stimulation;Therapeutic exercise;Ultrasound;Iontophoresis 4mg /ml Dexamethasone;Therapeutic activities   PT Next Visit Plan iontophoresis, stretching, HEP, ice   Consulted and Agree with Plan of Care Patient         Problem List Patient Active Problem List   Diagnosis Date Noted  . Abnormal cervical cytology 11/30/2014  . Abnormal loss of weight 11/30/2014  . Adverse drug effect 11/30/2014  . Anxiety and depression 11/30/2014  . Affective bipolar disorder (HCC) 11/30/2014  . Colon polyp 11/30/2014  . Coitalgia 11/30/2014  . Endometriosis 11/30/2014  . Family history of cardiovascular disease 11/30/2014  . Acid reflux 11/30/2014  . Big thyroid 11/30/2014  . Cannot sleep 11/30/2014  . Headache, migraine 11/30/2014  . Loss of feeling or sensation 11/30/2014  . Subclinical hypothyroidism 11/30/2014  . Current tobacco use 11/30/2014  . Head revolving around 11/30/2014  . Avitaminosis D 11/30/2014  . Depression 04/15/2011  . HYPERSOMNIA 08/03/2007  . DYSPNEA 07/16/2007  . DYSPNEA ON EXERTION 07/16/2007  . ABNORMAL PULMONARY TEST RESULTS 07/16/2007  . ASTHMA 06/22/2007  . FATIGUE, ACUTE 06/22/2007    Ezekiel InaMansfield, Jaylen Claude S 04/24/2015, 5:19 PM  [facility] MAIN [facility] [address] [place], [place], [address] Phone: [phone]   Fax:  [phone]  Name: Hannah [name] MRN: [id number] Date of Birth: 08/08/1965

## 2015-04-26 ENCOUNTER — Encounter: Payer: Self-pay | Admitting: Psychiatry

## 2015-04-26 ENCOUNTER — Ambulatory Visit (INDEPENDENT_AMBULATORY_CARE_PROVIDER_SITE_OTHER): Payer: BLUE CROSS/BLUE SHIELD | Admitting: Psychiatry

## 2015-04-26 VITALS — BP 108/70 | HR 76 | Temp 97.2°F | Ht 59.0 in | Wt 167.8 lb

## 2015-04-26 DIAGNOSIS — F419 Anxiety disorder, unspecified: Secondary | ICD-10-CM | POA: Insufficient documentation

## 2015-04-26 DIAGNOSIS — F331 Major depressive disorder, recurrent, moderate: Secondary | ICD-10-CM | POA: Diagnosis not present

## 2015-04-26 DIAGNOSIS — Z8669 Personal history of other diseases of the nervous system and sense organs: Secondary | ICD-10-CM | POA: Insufficient documentation

## 2015-04-26 MED ORDER — BUSPIRONE HCL 10 MG PO TABS: 10.0000 mg | ORAL_TABLET | Freq: Two times a day (BID) | ORAL | Status: DC

## 2015-04-26 MED ORDER — MELATONIN 2.5 MG PO CAPS: 2.5000 mg | ORAL_CAPSULE | Freq: Every day | ORAL | Status: DC

## 2015-04-26 MED ORDER — TOPIRAMATE 200 MG PO TABS: 200.0000 mg | ORAL_TABLET | Freq: Every day | ORAL | Status: DC

## 2015-04-26 NOTE — Progress Notes (Signed)
BH MD/PA/NP OP Progress Note  04/26/2015 2:50 PM Hannah [name]  MRN:  [id number]  Subjective:    Patient is a 49 year old female who presented for the follow-up appointment. Patient reported that she has been becoming more anxious and depressed and is isolating herself. She spent most of the time at home. Patient reported that she has been taking BuSpar on a regular basis but it is not helping. She took Topamax at bedtime. Patient reported that she is in ordering online groceries and is trying to avoid going outside her home. She stated that she has been compliant with her medications and is not experiencing any adverse effects of the medications. She denied having any suicidal ideations or plans.  Chief Complaint:  Chief Complaint    Follow-up; Medication Refill; Medication Problem; Anxiety     Visit Diagnosis:     ICD-9-CM ICD-10-CM   1. MDD (major depressive disorder), recurrent episode, moderate (HCC) 296.32 F33.1     Past Medical History:  Past Medical History  Diagnosis Date  . Migraine   . Allergy   . Asthma   . Depression   . Anxiety   . GERD (gastroesophageal reflux disease)     Past Surgical History  Procedure Laterality Date  . Wisdom tooth extraction      x 4  . Tonsillectomy    . Cesarean section       x2  . Appendectomy  2004  . Abdominal hysterectomy  2004  . Tubal ligation    . Cervical conization w/bx  11/1995  . Laposcophy  05/1996   Family History:  Family History  Problem Relation Age of Onset  . Adopted: Yes  . Arthritis Mother   . Depression Mother   . Hypertension Mother   . Heart disease Mother     CHF  . Kidney disease Mother   . Diabetes Father   . Depression Sister   . Cancer Brother     colon, lungs, liver  . Diabetes Paternal Grandmother    Social History:  Social History   Social History  . Marital Status: Married    Spouse Name: N/A  . Number of Children: N/A  . Years of Education: N/A   Social History Main Topics  .  Smoking status: Former Smoker -- 1.00 packs/day for 9 years    Types: Cigarettes    Quit date: 12/28/1990  . Smokeless tobacco: Never Used     Comment: quit 1992  . Alcohol Use: No     Comment: occasion  . Drug Use: No  . Sexual Activity:    Partners: Male    Birth Control/ Protection: Surgical   Other Topics Concern  . None   Social History Narrative   Additional History:  Currently lives with her husband. 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   Assessment:   Musculoskeletal: Strength & Muscle Tone: within normal limits Gait & Station: normal Patient leans: N/A  Psychiatric Specialty Exam: Anxiety Symptoms include nervous/anxious behavior. Patient reports no palpitations.            Associated symptoms include headaches.  Past medical history includes anxiety.     Review of Systems  Constitutional: Negative for chills and malaise/fatigue.  HENT: Negative for nosebleeds.   Eyes: Negative for pain.  Respiratory: Negative for hemoptysis.   Cardiovascular: Negative for palpitations and PND.  Gastrointestinal: Negative for vomiting.  Genitourinary: Negative for urgency.  Musculoskeletal: Positive for back pain.  Neurological: Positive for headaches. Negative for  tingling and speech change.  Endo/Heme/Allergies: Negative for environmental allergies.  Psychiatric/Behavioral: Negative for depression. The patient is nervous/anxious.   All other systems reviewed and are negative.   Blood pressure 108/70, pulse 76, temperature 97.2 F (36.2 C), temperature source Tympanic, height  (1.499 m), weight 167 lb 12.8 oz (76.114 kg), SpO2 99 %.Body mass index is 33.87 kg/(m^2).  General Appearance: Casual  Eye Contact:  Fair  Speech:  Normal Rate  Volume:  Normal  Mood:  Anxious  Affect:  Congruent  Thought Process:  Goal Directed  Orientation:  Full (Time, Place, and Person)  Thought Content:  WDL  Suicidal Thoughts:  No  Homicidal Thoughts:  No  Memory:   Immediate;   Fair  Judgement:  Fair  Insight:  Fair  Psychomotor Activity:  Normal  Concentration:  Fair  Recall:  [employer] of Knowledge: Fair  Language: Fair  Akathisia:  No  Handed:  Right  AIMS (if indicated):  none  Assets:  Communication Skills Desire for Improvement Physical Health Social Support  ADL's:  Intact  Cognition: WNL  Sleep:  6-7   Is the patient at risk to self?  No. Has the patient been a risk to self in the past 6 months?  No. Has the patient been a risk to self within the distant past?  No. Is the patient a risk to others?  No. Has the patient been a risk to others in the past 6 months?  No. Has the patient been a risk to others within the distant past?  No.  Current Medications: Current Outpatient Prescriptions  Medication Sig Dispense Refill  . Beclomethasone Dipropionate 80 MCG/ACT AERS Place into the nose.    . busPIRone (BUSPAR) 10 MG tablet Take 1 tablet (10 mg total) by mouth every morning. 30 tablet 1  . fexofenadine (ALLEGRA) 180 MG tablet Take by mouth.    . levothyroxine (SYNTHROID, LEVOTHROID) 100 MCG tablet Take 100 mcg by mouth daily before breakfast.    . meloxicam (MOBIC) 15 MG tablet Take 1 tablet (15 mg total) by mouth daily. 30 tablet 2  . Mometasone Furo-Formoterol Fum (DULERA IN) Inhale into the lungs.      . montelukast (SINGULAIR) 10 MG tablet Take by mouth.    . Ospemifene 60 MG TABS     . PROAIR HFA 108 (90 BASE) MCG/ACT inhaler INHALE 2 PUFFS AS NEEDED EVERY 4 - 6 HOURS FOR COUGH /WHEEZING  0  . topiramate (TOPAMAX) 200 MG tablet Take 200 mg by mouth 2 (two) times daily.     No current facility-administered medications for this visit.    Medical Decision Making:  Established Problem, Stable/Improving (1) and Review of Last Therapy Session (1)  Treatment Plan Summary:Medication management   Discussed with patient what the medications treatment risk benefits and alternatives.  Anxiety She will continue on buspirone 10 mg  twice daily She will continue on Topamax 200 mg at bedtime   Advised patient to call for help if she notices worsening of her symptoms and she demonstrated understanding.   More than 50% of the time spent in psychoeducation, counseling and coordination of care.    This note was generated in part or whole with voice recognition software. Voice regonition is usually quite accurate but there are transcription errors that can and very often do occur. I apologize for any typographical errors that were not detected and corrected.    Brandy Hale 04/26/2015, 2:50 PM

## 2015-05-01 ENCOUNTER — Ambulatory Visit: Payer: BLUE CROSS/BLUE SHIELD | Attending: Podiatry | Admitting: Physical Therapy

## 2015-05-01 DIAGNOSIS — M79672 Pain in left foot: Secondary | ICD-10-CM | POA: Insufficient documentation

## 2015-05-03 ENCOUNTER — Encounter: Payer: Self-pay | Admitting: Physical Therapy

## 2015-05-03 ENCOUNTER — Ambulatory Visit: Payer: BLUE CROSS/BLUE SHIELD | Admitting: Physical Therapy

## 2015-05-03 DIAGNOSIS — M79672 Pain in left foot: Secondary | ICD-10-CM

## 2015-05-03 NOTE — Therapy (Signed)
[place] [facility] [address] [place], [place], [address] Phone: [phone]   Fax:  [phone]  Physical Therapy Treatment  Patient Details  Name: Hannah [name] MRN: [id number] Date of Birth: 05/10/1966 Referring Provider: Loreta AveWagner  Encounter Date: 05/03/2015      PT End of Session - 05/03/15 1510    Visit Number 2   Number of Visits 17   PT Start Time 0245   PT Stop Time 0300   PT Time Calculation (min) 15 min   Activity Tolerance Patient limited by pain      Past Medical History  Diagnosis Date  . Migraine   . Allergy   . Asthma   . Depression   . Anxiety   . GERD (gastroesophageal reflux disease)     Past Surgical History  Procedure Laterality Date  . Wisdom tooth extraction      x 4  . Tonsillectomy    . Cesarean section       x2  . Appendectomy  2004  . Abdominal hysterectomy  2004  . Tubal ligation    . Cervical conization w/bx  11/1995  . Laposcophy  05/1996    There were no vitals filed for this visit.  Visit Diagnosis:  Left foot pain      Subjective Assessment - 05/03/15 1506    Subjective Patient has 3/10 pain her right foot.   Currently in Pain? Yes   Pain Score 3    Pain Descriptors / Indicators Aching   Pain Type Chronic pain   Pain Onset More than a month ago     Patient has 3/10 pain to right heel .  Iontophoresis with dexamethasone to right foot                            PT Education - 05/03/15 1509    Education provided Yes   Person(s) Educated Patient   Methods Explanation   Comprehension Verbalized understanding             PT Long Term Goals - 04/24/15 1709    PT LONG TERM GOAL #1   Title Patient will increase lower extremity functional scale to >60/80 to demonstrate improved functional mobility and increased tolerance with ADLs.    Time 4   Period Weeks   PT LONG TERM GOAL #2   Title Patient will report a worst pain of 3/10 on VAS in     left foot         to improve tolerance with ADLs and reduced symptoms with activities   Time 4   Period Weeks   PT LONG TERM GOAL #3   Title Patient will be independent with HEP to stretch and ice her left foot to decreaase pain and return to her PLOFL    Time 4   Period Weeks               Plan - 05/03/15 1510    Clinical Impression Statement Patient has right foot pain 3/10 and was treated with iontophoresis with dexamethasone to decrease pain.    Pt will benefit from skilled therapeutic intervention in order to improve on the following deficits Pain;Impaired flexibility;Difficulty walking   Rehab Potential Good   PT Frequency 2x / week   PT Treatment/Interventions Iontophoresis 4mg /ml Dexamethasone        Problem List Patient Active Problem List   Diagnosis Date Noted  .  Anxiety disorder 04/26/2015  . History of migraine headaches 04/26/2015  . Depression, major, recurrent, moderate (HCC) 04/26/2015  . Abnormal cervical cytology 11/30/2014  . Abnormal loss of weight 11/30/2014  . Adverse drug effect 11/30/2014  . Anxiety and depression 11/30/2014  . Affective bipolar disorder (HCC) 11/30/2014  . Colon polyp 11/30/2014  . Coitalgia 11/30/2014  . Endometriosis 11/30/2014  . Family history of cardiovascular disease 11/30/2014  . Acid reflux 11/30/2014  . Big thyroid 11/30/2014  . Cannot sleep 11/30/2014  . Headache, migraine 11/30/2014  . Loss of feeling or sensation 11/30/2014  . Subclinical hypothyroidism 11/30/2014  . Current tobacco use 11/30/2014  . Head revolving around 11/30/2014  . Avitaminosis D 11/30/2014  . Depression 04/15/2011  . HYPERSOMNIA 08/03/2007  . DYSPNEA 07/16/2007  . DYSPNEA ON EXERTION 07/16/2007  . ABNORMAL PULMONARY TEST RESULTS 07/16/2007  . ASTHMA 06/22/2007  . FATIGUE, ACUTE 06/22/2007    Ezekiel Ina 05/03/2015, 3:17 PM  [place] [facility] MAIN [facility] SERVICES [address]  [place], [place], [address] Phone: [phone]   Fax:  [phone]  Name: Hannah [name] MRN: [id number] Date of Birth: Mar 11, 1966

## 2015-05-07 ENCOUNTER — Ambulatory Visit: Payer: BLUE CROSS/BLUE SHIELD | Admitting: Physical Therapy

## 2015-05-08 ENCOUNTER — Encounter: Payer: BLUE CROSS/BLUE SHIELD | Admitting: Physical Therapy

## 2015-05-11 ENCOUNTER — Ambulatory Visit: Payer: BLUE CROSS/BLUE SHIELD | Admitting: Physical Therapy

## 2015-05-17 ENCOUNTER — Ambulatory Visit: Payer: BLUE CROSS/BLUE SHIELD

## 2015-05-17 DIAGNOSIS — M79672 Pain in left foot: Secondary | ICD-10-CM

## 2015-05-17 NOTE — Therapy (Signed)
[place] [facility] MAIN [facility] SERVICES [address] [place], [place], [address] Phone: [phone]   Fax:  [phone]  Physical Therapy Treatment  Patient Details  Name: Hannah [name] MRN: [id number] Date of Birth: 1966/05/07 Referring Provider: Loreta Ave  Encounter Date: 05/17/2015      PT End of Session - 05/17/15 1525    Visit Number 3   Number of Visits 17   PT Start Time 1437   PT Stop Time 1515   PT Time Calculation (min) 38 min   Activity Tolerance Patient limited by pain      Past Medical History  Diagnosis Date  . Migraine   . Allergy   . Asthma   . Depression   . Anxiety   . GERD (gastroesophageal reflux disease)     Past Surgical History  Procedure Laterality Date  . Wisdom tooth extraction      x 4  . Tonsillectomy    . Cesarean section       x2  . Appendectomy  2004  . Abdominal hysterectomy  2004  . Tubal ligation    . Cervical conization w/bx  11/1995  . Laposcophy  05/1996    There were no vitals filed for this visit.  Visit Diagnosis:  Left foot pain      Subjective Assessment - 05/17/15 1524    Subjective pt reports her heel feels achy 4/10- but its her fault for "wearing toms"   Currently in Pain? Yes   Pain Score 4    Pain Location --  L heel   Pain Onset More than a month ago      Therex: Calf stretch in sitting with towel 30s x 2 gastroc soleus stretch in standing 30s x 2 each  PT assessed fore foot and rear foot alignment - pt pronates and has forefoot varus. Pt does have custom orthotics Pt has reduced dorsiflexion bilaterally L>R (R 5 deg, L -5deg)  Manual therapy: Soft tissue massage and AISTM to the L calf, achilles tendon and plantar fascia : Efflurage, muscle stripping, and using edge tool.  Passive gastroc and soleus stretch 30s x 2   multiple trigger points noted in lateral gastroc and soleus Crepitus in medial arch                           PT Education -  05/17/15 1525    Education provided Yes   Education Details HEP review with stretching (soleus too), rec. ice massage   Person(s) Educated Patient   Methods Explanation   Comprehension Verbalized understanding             PT Long Term Goals - 04/24/15 1709    PT LONG TERM GOAL #1   Title Patient will increase lower extremity functional scale to >60/80 to demonstrate improved functional mobility and increased tolerance with ADLs.    Time 4   Period Weeks   PT LONG TERM GOAL #2   Title Patient will report a worst pain of 3/10 on VAS in    left foot         to improve tolerance with ADLs and reduced symptoms with activities   Time 4   Period Weeks   PT LONG TERM GOAL #3   Title Patient will be independent with HEP to stretch and ice her left foot to decreaase pain and return to her PLOFL    Time 4   Period Weeks  Plan - 05/17/15 1527    Clinical Impression Statement pt demonstrated reduced dorsiflexion (-6deg vs RLE) on the L side suggesting increased calf tightness and reduced flexibility. pt ed on how this is likely effecting her plantar fascia pain. pt had improved flexibility and less pain following treatment. encouraged pt to continue to wear orthotics and stretch. also recommended self ice massage.    Pt will benefit from skilled therapeutic intervention in order to improve on the following deficits Pain;Impaired flexibility;Difficulty walking   Rehab Potential Good   PT Frequency 2x / week   PT Treatment/Interventions Iontophoresis 4mg /ml Dexamethasone        Problem List Patient Active Problem List   Diagnosis Date Noted  . Anxiety disorder 04/26/2015  . History of migraine headaches 04/26/2015  . Depression, major, recurrent, moderate (HCC) 04/26/2015  . Abnormal cervical cytology 11/30/2014  . Abnormal loss of weight 11/30/2014  . Adverse drug effect 11/30/2014  . Anxiety and depression 11/30/2014  . Affective bipolar disorder (HCC)  11/30/2014  . Colon polyp 11/30/2014  . Coitalgia 11/30/2014  . Endometriosis 11/30/2014  . Family history of cardiovascular disease 11/30/2014  . Acid reflux 11/30/2014  . Big thyroid 11/30/2014  . Cannot sleep 11/30/2014  . Headache, migraine 11/30/2014  . Loss of feeling or sensation 11/30/2014  . Subclinical hypothyroidism 11/30/2014  . Current tobacco use 11/30/2014  . Head revolving around 11/30/2014  . Avitaminosis D 11/30/2014  . Depression 04/15/2011  . HYPERSOMNIA 08/03/2007  . DYSPNEA 07/16/2007  . DYSPNEA ON EXERTION 07/16/2007  . ABNORMAL PULMONARY TEST RESULTS 07/16/2007  . ASTHMA 06/22/2007  . FATIGUE, ACUTE 06/22/2007   Hannah [name], PT, DPT [id number]  Hannah [name] 05/17/2015, 3:30 PM  [place] [facility] [address] [place], [place], [address] Phone: [phone]   Fax:  [phone]  Name: Hannah [name] MRN: [id number] Date of Birth: 10/07/1965

## 2015-05-29 ENCOUNTER — Encounter: Payer: Self-pay | Admitting: Physical Therapy

## 2015-05-29 ENCOUNTER — Ambulatory Visit: Payer: BLUE CROSS/BLUE SHIELD | Admitting: Physical Therapy

## 2015-05-29 DIAGNOSIS — M79672 Pain in left foot: Secondary | ICD-10-CM

## 2015-05-29 NOTE — Therapy (Signed)
[facility] MAIN [facility] SERVICES [address] [place], [place], [address] Phone: [phone]   Fax:  [phone]  Physical Therapy Treatment  Patient Details  Name: Hannah [name] MRN: [id number] Date of Birth: Dec 01, 1965 Referring Provider: Loreta Ave  Encounter Date: 05/29/2015      PT End of Session - 05/29/15 1546    Visit Number 4   Number of Visits 17   PT Start Time 0235   PT Stop Time 0315   PT Time Calculation (min) 40 min   Activity Tolerance Patient limited by pain      Past Medical History  Diagnosis Date  . Migraine   . Allergy   . Asthma   . Depression   . Anxiety   . GERD (gastroesophageal reflux disease)     Past Surgical History  Procedure Laterality Date  . Wisdom tooth extraction      x 4  . Tonsillectomy    . Cesarean section       x2  . Appendectomy  2004  . Abdominal hysterectomy  2004  . Tubal ligation    . Cervical conization w/bx  11/1995  . Laposcophy  05/1996    There were no vitals filed for this visit.  Visit Diagnosis:  Left foot pain      Subjective Assessment - 05/29/15 1525    Subjective (p) pt reports her heel feels achy 1/10. She says that she is not wearing her splint at night.    Currently in Pain? (p) Yes   Pain Score (p) 1    Pain Onset (p) More than a month ago       Patient is having 1/10 pain to left foot and responds to manual therapy and [place] at 1.5 cm squared to reduce pain and improve function.  Reviewed HEP and stretching and recommended that she continue to wear her boot at night until the pain is gone.                           PT Education - 05/29/15 1546    Education provided Yes   Education Details HEP   Person(s) Educated Patient   Methods Explanation   Comprehension Verbalized understanding             PT Long Term Goals - 04/24/15 1709    PT LONG TERM GOAL #1   Title Patient will increase lower extremity functional scale to  >60/80 to demonstrate improved functional mobility and increased tolerance with ADLs.    Time 4   Period Weeks   PT LONG TERM GOAL #2   Title Patient will report a worst pain of 3/10 on VAS in    left foot         to improve tolerance with ADLs and reduced symptoms with activities   Time 4   Period Weeks   PT LONG TERM GOAL #3   Title Patient will be independent with HEP to stretch and ice her left foot to decreaase pain and return to her PLOFL    Time 4   Period Weeks               Plan - 05/29/15 1547    Clinical Impression Statement Patient was seen for manual therapy to left foot to reduce pain and tenderness in left foot followed by [place] to left foot.    Pt will benefit from skilled therapeutic intervention in order to  improve on the following deficits Pain;Impaired flexibility;Difficulty walking   Rehab Potential Good   PT Frequency 2x / week   PT Treatment/Interventions Iontophoresis 4mg /ml Dexamethasone        Problem List Patient Active Problem List   Diagnosis Date Noted  . Anxiety disorder 04/26/2015  . History of migraine headaches 04/26/2015  . Depression, major, recurrent, moderate (HCC) 04/26/2015  . Abnormal cervical cytology 11/30/2014  . Abnormal loss of weight 11/30/2014  . Adverse drug effect 11/30/2014  . Anxiety and depression 11/30/2014  . Affective bipolar disorder (HCC) 11/30/2014  . Colon polyp 11/30/2014  . Coitalgia 11/30/2014  . Endometriosis 11/30/2014  . Family history of cardiovascular disease 11/30/2014  . Acid reflux 11/30/2014  . Big thyroid 11/30/2014  . Cannot sleep 11/30/2014  . Headache, migraine 11/30/2014  . Loss of feeling or sensation 11/30/2014  . Subclinical hypothyroidism 11/30/2014  . Current tobacco use 11/30/2014  . Head revolving around 11/30/2014  . Avitaminosis D 11/30/2014  . Depression 04/15/2011  . HYPERSOMNIA 08/03/2007  . DYSPNEA 07/16/2007  . DYSPNEA ON EXERTION 07/16/2007  . ABNORMAL PULMONARY TEST  RESULTS 07/16/2007  . ASTHMA 06/22/2007  . FATIGUE, ACUTE 06/22/2007    Ezekiel InaMansfield, Eunice Oldaker S 05/29/2015, 3:49 PM  [facility] [address] [place], [place], [address] Phone: [phone]   Fax:  [phone]  Name: Presley RaddleKelly B Broz MRN: [id number] Date of Birth: 04/01/1966

## 2015-05-31 NOTE — Progress Notes (Signed)
These medications were taken out because it was a duplicate.  Pt had refilled on  04-26-15

## 2015-06-04 ENCOUNTER — Ambulatory Visit: Payer: BLUE CROSS/BLUE SHIELD | Attending: Podiatry

## 2015-06-04 DIAGNOSIS — M79672 Pain in left foot: Secondary | ICD-10-CM | POA: Diagnosis not present

## 2015-06-04 NOTE — Therapy (Signed)
[place] MAIN [facility] SERVICES [address] [place], [place], [address] Phone: [phone]   Fax:  [phone]  Physical Therapy Treatment  Patient Details  Name: Hannah [name] MRN: [id number] Date of Birth: 03/12/1966 Referring Provider: Earleen Newport  Encounter Date: 06/04/2015      PT End of Session - 06/04/15 1757    Visit Number 5   Number of Visits 17   PT Start Time 1630   PT Stop Time 6568   PT Time Calculation (min) 45 min   Activity Tolerance Patient limited by pain      Past Medical History  Diagnosis Date  . Migraine   . Allergy   . Asthma   . Depression   . Anxiety   . GERD (gastroesophageal reflux disease)     Past Surgical History  Procedure Laterality Date  . Wisdom tooth extraction      x 4  . Tonsillectomy    . Cesarean section       x2  . Appendectomy  2004  . Abdominal hysterectomy  2004  . Tubal ligation    . Cervical conization w/bx  11/1995  . Laposcophy  05/1996    There were no vitals filed for this visit.  Visit Diagnosis:  Left foot pain      Subjective Assessment - 06/04/15 1756    Subjective pt reports she has returned to exercising gradually. pt reports her foot feels 75% better   Currently in Pain? No/denies   Pain Onset More than a month ago         manual therapy  extensive Soft tissue massage and AISTM to the L calf, achilles tendon and plantar fascia : Efflurage, muscle stripping, and using edge tool.  Passive gastroc and soleus stretch 30s x 2   multiple trigger points noted in lateral gastroc and soleus Crepitus in medial arch                         PT Education - 06/04/15 1757    Education provided Yes   Education Details gentle exercise progression, non impact vs impact   Person(s) Educated Patient   Methods Explanation   Comprehension Verbalized understanding             PT Long Term Goals - 06/04/15 1758    PT LONG TERM GOAL #1   Title  Patient will increase lower extremity functional scale to >60/80 to demonstrate improved functional mobility and increased tolerance with ADLs.    Time 4   Period Weeks   Status Achieved   PT LONG TERM GOAL #2   Title Patient will report a worst pain of 3/10 on VAS in    left foot         to improve tolerance with ADLs and reduced symptoms with activities   Baseline on and off   Time 4   Period Weeks   Status Partially Met   PT LONG TERM GOAL #3   Title Patient will be independent with HEP to stretch and ice her left foot to decreaase pain and return to her PLOFL    Time 4   Period Weeks   Status Achieved               Plan - 06/04/15 1757    Clinical Impression Statement pt has achieved PT goals at this time and has gradually returned to PLOF. pt will be placed on hold from  PT at this time pending her return to activity. pt will call to come back within 1 month of her pain gets worse.    Pt will benefit from skilled therapeutic intervention in order to improve on the following deficits Pain;Impaired flexibility;Difficulty walking   Rehab Potential Good   PT Frequency 2x / week   PT Treatment/Interventions Iontophoresis 4mg /ml Dexamethasone        Problem List Patient Active Problem List   Diagnosis Date Noted  . Anxiety disorder 04/26/2015  . History of migraine headaches 04/26/2015  . Depression, major, recurrent, moderate ([place]) 04/26/2015  . Abnormal cervical cytology 11/30/2014  . Abnormal loss of weight 11/30/2014  . Adverse drug effect 11/30/2014  . Anxiety and depression 11/30/2014  . Affective bipolar disorder ([place]) 11/30/2014  . Colon polyp 11/30/2014  . Coitalgia 11/30/2014  . Endometriosis 11/30/2014  . Family history of cardiovascular disease 11/30/2014  . Acid reflux 11/30/2014  . Big thyroid 11/30/2014  . Cannot sleep 11/30/2014  . Headache, migraine 11/30/2014  . Loss of feeling or sensation 11/30/2014  . Subclinical hypothyroidism 11/30/2014  .  Current tobacco use 11/30/2014  . Head revolving around 11/30/2014  . Avitaminosis D 11/30/2014  . Depression 04/15/2011  . HYPERSOMNIA 08/03/2007  . DYSPNEA 07/16/2007  . DYSPNEA ON EXERTION 07/16/2007  . ABNORMAL PULMONARY TEST RESULTS 07/16/2007  . ASTHMA 06/22/2007  . FATIGUE, ACUTE 06/22/2007   Gorden Harms. Paulita Licklider, PT, DPT [phone]  Hannah [name] 06/04/2015, 5:59 PM  [place] MAIN [facility] SERVICES [address] [place], [place], [address] Phone: [phone]   Fax:  [phone]  Name: Hannah [name] MRN: [id number] Date of Birth: 08/11/1965

## 2015-06-12 ENCOUNTER — Ambulatory Visit: Payer: BLUE CROSS/BLUE SHIELD | Admitting: Psychiatry

## 2015-06-18 ENCOUNTER — Ambulatory Visit
Admission: RE | Admit: 2015-06-18 | Discharge: 2015-06-18 | Disposition: A | Discharge status: Home / Self Care | Payer: BLUE CROSS/BLUE SHIELD | Source: Ambulatory Visit | Attending: Unknown Physician Specialty | Admitting: Unknown Physician Specialty

## 2015-06-18 DIAGNOSIS — E041 Nontoxic single thyroid nodule: Secondary | ICD-10-CM | POA: Diagnosis present

## 2015-07-01 ENCOUNTER — Other Ambulatory Visit: Payer: Self-pay | Admitting: Podiatry

## 2015-07-01 ENCOUNTER — Other Ambulatory Visit: Payer: Self-pay | Admitting: Psychiatry

## 2015-07-10 ENCOUNTER — Ambulatory Visit: Payer: BLUE CROSS/BLUE SHIELD | Admitting: Psychiatry

## 2015-08-16 ENCOUNTER — Ambulatory Visit (INDEPENDENT_AMBULATORY_CARE_PROVIDER_SITE_OTHER): Payer: BLUE CROSS/BLUE SHIELD | Admitting: Family Medicine

## 2015-08-16 ENCOUNTER — Encounter: Payer: Self-pay | Admitting: Family Medicine

## 2015-08-16 VITALS — BP 106/72 | HR 64 | Temp 98.8°F | Resp 16 | Wt 169.0 lb

## 2015-08-16 DIAGNOSIS — M545 Low back pain, unspecified: Secondary | ICD-10-CM

## 2015-08-16 DIAGNOSIS — M7071 Other bursitis of hip, right hip: Secondary | ICD-10-CM

## 2015-08-16 DIAGNOSIS — M7072 Other bursitis of hip, left hip: Secondary | ICD-10-CM

## 2015-08-16 DIAGNOSIS — R109 Unspecified abdominal pain: Secondary | ICD-10-CM | POA: Diagnosis not present

## 2015-08-16 LAB — POCT URINALYSIS DIPSTICK
Bilirubin, UA: NEGATIVE
Blood, UA: NEGATIVE
GLUCOSE UA: NEGATIVE
Ketones, UA: NEGATIVE
Leukocytes, UA: NEGATIVE
NITRITE UA: NEGATIVE
Protein, UA: NEGATIVE
SPEC GRAV UA: 1.025
UROBILINOGEN UA: 0.2
pH, UA: 5

## 2015-08-16 NOTE — Progress Notes (Signed)
Subjective:    Patient ID: Hannah [name], female    DOB: Feb 22, 1966, 50 y.o.   MRN: [id number]  Urinary Tract Infection  This is a new problem. The current episode started in the past 7 days. The problem has been gradually worsening. The quality of the pain is described as aching and burning (especially in right low back. ). The pain is moderate. There has been no fever. There is no history of pyelonephritis. Associated symptoms include hesitancy and nausea. Pertinent negatives include no chills. Flank pain: worsening. Really low back.  She has tried increased fluids and NSAIDs for the symptoms. The treatment provided no relief. There is no history of kidney stones or recurrent UTIs.   Just saw specialist for foot. Was on Meloxicam.  Did also see specialist for elbow pain.  Restart meloxicam and has not helped.     Review of Systems  Constitutional: Negative for chills.  Gastrointestinal: Positive for nausea.  Genitourinary: Positive for hesitancy. Flank pain: worsening. Really low back.    Patient Active Problem List   Diagnosis Date Noted  . Anxiety disorder 04/26/2015  . History of migraine headaches 04/26/2015  . Depression, major, recurrent, moderate (HCC) 04/26/2015  . Abnormal cervical cytology 11/30/2014  . Abnormal loss of weight 11/30/2014  . Adverse drug effect 11/30/2014  . Anxiety and depression 11/30/2014  . Affective bipolar disorder (HCC) 11/30/2014  . Colon polyp 11/30/2014  . Coitalgia 11/30/2014  . Endometriosis 11/30/2014  . Family history of cardiovascular disease 11/30/2014  . Acid reflux 11/30/2014  . Big thyroid 11/30/2014  . Cannot sleep 11/30/2014  . Headache, migraine 11/30/2014  . Loss of feeling or sensation 11/30/2014  . Subclinical hypothyroidism 11/30/2014  . Current tobacco use 11/30/2014  . Head revolving around 11/30/2014  . Avitaminosis D 11/30/2014  . Depression 04/15/2011  . HYPERSOMNIA 08/03/2007  . DYSPNEA 07/16/2007  . DYSPNEA ON  EXERTION 07/16/2007  . ABNORMAL PULMONARY TEST RESULTS 07/16/2007  . ASTHMA 06/22/2007  . FATIGUE, ACUTE 06/22/2007   Past Medical History  Diagnosis Date  . Migraine   . Allergy   . Asthma   . Depression   . Anxiety   . GERD (gastroesophageal reflux disease)    Current Outpatient Prescriptions on File Prior to Visit  Medication Sig  . Beclomethasone Dipropionate 80 MCG/ACT AERS Place into the nose.  . busPIRone (BUSPAR) 10 MG tablet TAKE 1 TABLET (10 MG TOTAL) BY MOUTH 2 (TWO) TIMES DAILY.  . fexofenadine (ALLEGRA) 180 MG tablet Take by mouth.  . Melatonin 2.5 MG CAPS Take 1 capsule (2.5 mg total) by mouth at bedtime.  . meloxicam (MOBIC) 15 MG tablet Take 1 tablet (15 mg total) by mouth daily.  . Mometasone Furo-Formoterol Fum (DULERA IN) Inhale into the lungs.    . montelukast (SINGULAIR) 10 MG tablet Take by mouth.  Marland Kitchen PROAIR HFA 108 (90 BASE) MCG/ACT inhaler INHALE 2 PUFFS AS NEEDED EVERY 4 - 6 HOURS FOR COUGH /WHEEZING  . topiramate (TOPAMAX) 200 MG tablet TAKE 1 TABLET (200 MG TOTAL) BY MOUTH DAILY.   No current facility-administered medications on file prior to visit.   Allergies  Allergen Reactions  . Cough-Cold Medicine  [Phenir-Pe-Sod Sal-Caff Cit] Itching  . Erythromycin   . Levofloxacin     headache, chest discomfort, insomnia  . Sulfonamide Derivatives   . Sulfur   . Tetracyclines & Related   . Tramadol     Lips and mouth numbness.   Past Surgical History  Procedure  Laterality Date  . Wisdom tooth extraction      x 4  . Tonsillectomy    . Cesarean section       x2  . Appendectomy  2004  . Abdominal hysterectomy  2004  . Tubal ligation    . Cervical conization w/bx  11/1995  . Laposcophy  05/1996   Social History   Social History  . Marital Status: Married    Spouse Name: N/A  . Number of Children: N/A  . Years of Education: N/A   Occupational History  . Not on file.   Social History Main Topics  . Smoking status: Former Smoker -- 1.00  packs/day for 9 years    Types: Cigarettes    Quit date: 12/28/1990  . Smokeless tobacco: Never Used     Comment: quit 1992  . Alcohol Use: No     Comment: occasion  . Drug Use: No  . Sexual Activity:    Partners: Male    Birth Control/ Protection: Surgical   Other Topics Concern  . Not on file   Social History Narrative   Family History  Problem Relation Age of Onset  . Adopted: Yes  . Arthritis Mother   . Depression Mother   . Hypertension Mother   . Heart disease Mother     CHF  . Kidney disease Mother   . Diabetes Father   . Depression Sister   . Cancer Brother     colon, lungs, liver  . Diabetes Paternal Grandmother         Objective:   Physical Exam  Constitutional: She is oriented to person, place, and time. She appears well-developed and well-nourished.  Musculoskeletal: She exhibits tenderness (right low bacn and biliateral hip bursas.).  No pain with internal or external rotation of her hips. Straight leg raises negative.    Neurological: She is alert and oriented to person, place, and time.    BP 106/72 mmHg  Pulse 64  Temp(Src) 98.8 F (37.1 C) (Oral)  Resp 16  Wt 169 lb (76.658 kg)     Assessment & Plan:  1. Flank pain Urine negative.  Do not think stone or infection.  - POCT urinalysis dipstick  2. Bilateral low back pain without sciatica Continue Meloxicam  And stretching exercises.  Referral to orthopedics if does not improve.    3. Bursitis of both hips Ice hip 2 to 3 times a day.    Lorie Phenix, MD

## 2015-11-02 ENCOUNTER — Encounter: Payer: Self-pay | Admitting: Physician Assistant

## 2015-11-02 ENCOUNTER — Ambulatory Visit (INDEPENDENT_AMBULATORY_CARE_PROVIDER_SITE_OTHER): Payer: BLUE CROSS/BLUE SHIELD | Admitting: Physician Assistant

## 2015-11-02 VITALS — BP 108/62 | HR 60 | Temp 97.8°F | Resp 16 | Wt 161.0 lb

## 2015-11-02 DIAGNOSIS — S30860A Insect bite (nonvenomous) of lower back and pelvis, initial encounter: Secondary | ICD-10-CM | POA: Diagnosis not present

## 2015-11-02 DIAGNOSIS — W57XXXA Bitten or stung by nonvenomous insect and other nonvenomous arthropods, initial encounter: Secondary | ICD-10-CM | POA: Diagnosis not present

## 2015-11-02 NOTE — Patient Instructions (Signed)
Tick Bite Information Ticks are insects that attach themselves to the skin and draw blood for food. There are various types of ticks. Common types include wood ticks and deer ticks. Most ticks live in shrubs and grassy areas. Ticks can climb onto your body when you make contact with leaves or grass where the tick is waiting. The most common places on the body for ticks to attach themselves are the scalp, neck, armpits, waist, and groin. Most tick bites are harmless, but sometimes ticks carry germs that cause diseases. These germs can be spread to a person during the tick's feeding process. The chance of a disease spreading through a tick bite depends on:   The type of tick.  Time of year.   How long the tick is attached.   Geographic location.  HOW CAN YOU PREVENT TICK BITES? Take these steps to help prevent tick bites when you are outdoors:  Wear protective clothing. Long sleeves and long pants are best.   Wear white clothes so you can see ticks more easily.  Tuck your pant legs into your socks.   If walking on a trail, stay in the middle of the trail to avoid brushing against bushes.  Avoid walking through areas with long grass.  Put insect repellent on all exposed skin and along boot tops, pant legs, and sleeve cuffs.   Check clothing, hair, and skin repeatedly and before going inside.   Brush off any ticks that are not attached.  Take a shower or bath as soon as possible after being outdoors.  WHAT IS THE PROPER WAY TO REMOVE A TICK? Ticks should be removed as soon as possible to help prevent diseases caused by tick bites. 1. If latex gloves are available, put them on before trying to remove a tick.  2. Using fine-point tweezers, grasp the tick as close to the skin as possible. You may also use curved forceps or a tick removal tool. Grasp the tick as close to its head as possible. Avoid grasping the tick on its body. 3. Pull gently with steady upward pressure until  the tick lets go. Do not twist the tick or jerk it suddenly. This may break off the tick's head or mouth parts. 4. Do not squeeze or crush the tick's body. This could force disease-carrying fluids from the tick into your body.  5. After the tick is removed, wash the bite area and your hands with soap and water or other disinfectant such as alcohol. 6. Apply a small amount of antiseptic cream or ointment to the bite site.  7. Wash and disinfect any instruments that were used.  Do not try to remove a tick by applying a hot match, petroleum jelly, or fingernail polish to the tick. These methods do not work and may increase the chances of disease being spread from the tick bite.  WHEN SHOULD YOU SEEK MEDICAL CARE? Contact your health care provider if you are unable to remove a tick from your skin or if a part of the tick breaks off and is stuck in the skin.  After a tick bite, you need to be aware of signs and symptoms that could be related to diseases spread by ticks. Contact your health care provider if you develop any of the following in the days or weeks after the tick bite:  Unexplained fever.  Rash. A circular rash that appears days or weeks after the tick bite may indicate the possibility of Lyme disease. The rash may resemble   a target with a bull's-eye and may occur at a different part of your body than the tick bite.  Redness and swelling in the area of the tick bite.   Tender, swollen lymph glands.   Diarrhea.   Weight loss.   Cough.   Fatigue.   Muscle, joint, or bone pain.   Abdominal pain.   Headache.   Lethargy or a change in your level of consciousness.  Difficulty walking or moving your legs.   Numbness in the legs.   Paralysis.  Shortness of breath.   Confusion.   Repeated vomiting.    This information is not intended to replace advice given to you by your health care provider. Make sure you discuss any questions you have with your health  care provider.   Document Released: 06/13/2000 Document Revised: 07/07/2014 Document Reviewed: 11/24/2012 Elsevier Interactive Patient Education 2016 [employer]  

## 2015-11-02 NOTE — Progress Notes (Signed)
Patient ID: Hannah [name] Mccarver, female   DOB: 12/07/1965, [age] y.o.   MRN: [id number]       Patient: Hannah [name] Din Female    DOB: 01/26/1966   50 y.o.   MRN: [id number] Visit Date: 11/02/2015  Today's Provider: Margaretann LovelessJennifer M Orian Amberg, PA-C   Chief Complaint  Patient presents with  . Insect Bite    tick   Subjective:    HPI Pt is her today for tick bite. It was found and removed about 3 days ago. It is located on her upper back, near her bra strap. The area is extremely itchy and red. She is not having fevers, chills, or fatigue. She does have a sore left elbow but this has been bothering her for a little while. She does have bursitis in her hips and states it feels similar. She does not recall an injury to the joint.  She has a couple other spots on her left arm and leg that look like bites of some sort as well, itch some but not as bad as tick bite. She was outside this past weekend.  Also of note she has discontinued all of her medications on her own and is trying natural supplements and essential oils. She is doing weight watchers for weight loss and is considering a vegetarian lifestyle.    Allergies  Allergen Reactions  . Cough-Cold Medicine  [Phenir-Pe-Sod Sal-Caff Cit] Itching  . Erythromycin   . Levofloxacin     headache, chest discomfort, insomnia  . Sulfonamide Derivatives   . Sulfur   . Tetracyclines & Related   . Tramadol     Lips and mouth numbness.   Previous Medications   BECLOMETHASONE DIPROPIONATE 80 MCG/ACT AERS    Place into the nose. Reported on 11/02/2015   BUSPIRONE (BUSPAR) 10 MG TABLET    TAKE 1 TABLET (10 MG TOTAL) BY MOUTH 2 (TWO) TIMES DAILY.   FEXOFENADINE (ALLEGRA) 180 MG TABLET    Take by mouth. Reported on 11/02/2015   MELATONIN 2.5 MG CAPS    Take 1 capsule (2.5 mg total) by mouth at bedtime.   MELOXICAM (MOBIC) 15 MG TABLET    Take 1 tablet (15 mg total) by mouth daily.   MOMETASONE FURO-FORMOTEROL FUM (DULERA IN)    Inhale into the lungs. Reported on  11/02/2015   MONTELUKAST (SINGULAIR) 10 MG TABLET    Take by mouth. Reported on 11/02/2015   PROAIR HFA 108 (90 BASE) MCG/ACT INHALER    Reported on 11/02/2015   TOPIRAMATE (TOPAMAX) 200 MG TABLET    TAKE 1 TABLET (200 MG TOTAL) BY MOUTH DAILY.    Review of Systems  Constitutional: Negative.   HENT: Negative.   Eyes: Negative.   Respiratory: Negative.   Cardiovascular: Negative.   Gastrointestinal: Negative.   Endocrine: Negative.   Genitourinary: Negative.   Musculoskeletal: Negative.   Skin: Positive for wound.       Tick bites  Allergic/Immunologic: Negative.   Neurological: Negative.   Hematological: Negative.   Psychiatric/Behavioral: Negative.     Social History  Substance Use Topics  . Smoking status: Former Smoker -- 1.00 packs/day for 9 years    Types: Cigarettes    Quit date: 12/28/1990  . Smokeless tobacco: Never Used     Comment: quit 1992  . Alcohol Use: Yes     Comment: occasion "rare"   Objective:   BP 108/62 mmHg  Pulse 60  Temp(Src) 97.8 F (36.6 C) (Oral)  Resp 16  Wt 161  lb (73.029 kg)  Physical Exam  Constitutional: She appears well-developed and well-nourished. No distress.  Neck: Normal range of motion. Neck supple.  Cardiovascular: Normal rate, regular rhythm and normal heart sounds.  Exam reveals no gallop and no friction rub.   No murmur heard. Pulmonary/Chest: Effort normal and breath sounds normal. No respiratory distress. She has no wheezes. She has no rales.  Skin: She is not diaphoretic.     Vitals reviewed.     Assessment & Plan:     1. Tick bite of back, initial encounter Low suspicion for lyme or RMSF. Advised to continue to monitor and to call if rash increases in size or if she develops fever. She agrees. Discussed tick prevention.       Margaretann Loveless, PA-C  [facility]

## 2016-04-23 ENCOUNTER — Encounter: Payer: Self-pay | Admitting: Emergency Medicine

## 2016-04-23 ENCOUNTER — Emergency Department
Admission: EM | Admit: 2016-04-23 | Discharge: 2016-04-23 | Disposition: A | Discharge status: Home / Self Care | Payer: BLUE CROSS/BLUE SHIELD | Attending: Emergency Medicine | Admitting: Emergency Medicine

## 2016-04-23 ENCOUNTER — Emergency Department: Payer: BLUE CROSS/BLUE SHIELD

## 2016-04-23 DIAGNOSIS — S0990XA Unspecified injury of head, initial encounter: Secondary | ICD-10-CM | POA: Diagnosis present

## 2016-04-23 DIAGNOSIS — S161XXA Strain of muscle, fascia and tendon at neck level, initial encounter: Secondary | ICD-10-CM

## 2016-04-23 DIAGNOSIS — W01198A Fall on same level from slipping, tripping and stumbling with subsequent striking against other object, initial encounter: Secondary | ICD-10-CM | POA: Diagnosis not present

## 2016-04-23 DIAGNOSIS — Z79899 Other long term (current) drug therapy: Secondary | ICD-10-CM | POA: Insufficient documentation

## 2016-04-23 DIAGNOSIS — J45909 Unspecified asthma, uncomplicated: Secondary | ICD-10-CM | POA: Insufficient documentation

## 2016-04-23 DIAGNOSIS — Y92481 Parking lot as the place of occurrence of the external cause: Secondary | ICD-10-CM | POA: Diagnosis not present

## 2016-04-23 DIAGNOSIS — E039 Hypothyroidism, unspecified: Secondary | ICD-10-CM | POA: Insufficient documentation

## 2016-04-23 DIAGNOSIS — Z791 Long term (current) use of non-steroidal anti-inflammatories (NSAID): Secondary | ICD-10-CM | POA: Diagnosis not present

## 2016-04-23 DIAGNOSIS — Z87891 Personal history of nicotine dependence: Secondary | ICD-10-CM | POA: Diagnosis not present

## 2016-04-23 DIAGNOSIS — Y999 Unspecified external cause status: Secondary | ICD-10-CM | POA: Insufficient documentation

## 2016-04-23 DIAGNOSIS — Y939 Activity, unspecified: Secondary | ICD-10-CM | POA: Insufficient documentation

## 2016-04-23 MED ORDER — IBUPROFEN 800 MG PO TABS: 800.0000 mg | ORAL_TABLET | Freq: Three times a day (TID) | ORAL | 0 refills | Status: DC | PRN

## 2016-04-23 MED ORDER — LORAZEPAM 1 MG PO TABS: 1.0000 mg | ORAL_TABLET | Freq: Two times a day (BID) | ORAL | 0 refills | Status: DC | PRN

## 2016-04-23 MED ORDER — IBUPROFEN 400 MG PO TABS
600.0000 mg | ORAL_TABLET | Freq: Once | ORAL | Status: AC
Start: 2016-04-23 — End: 2016-04-23
  Administered 2016-04-23: 600 mg via ORAL
  Filled 2016-04-23: qty 2

## 2016-04-23 NOTE — ED Notes (Signed)
C-collar applied in triage.

## 2016-04-23 NOTE — ED Provider Notes (Signed)
Time Seen: Approximately 1837 I have reviewed the triage notes  Chief Complaint: Fall   History of Present Illness: Hannah [name] is a 50 y.o. female who states that she tripped over a parking guard that was on the ground and put her arms out to stop her fall and struck her right side of her head on her purse. Patient's primarily stating right sided headache more so right-sided muscle spasm and neck pain. Radiates towards the upper back pain she has been ambulatory without difficulty and denies any chest or abdominal pain. She denies any loss of consciousness. No nausea, vomiting, photophobia. She describes a right arm heaviness which she states is typical for her to raise her arm above her head without significant pain denies any right upper extremity weakness or pain directly in the right shoulder region   Past Medical History:  Diagnosis Date  . Allergy   . Anxiety   . Asthma   . Depression   . GERD (gastroesophageal reflux disease)   . Migraine     Patient Active Problem List   Diagnosis Date Noted  . Anxiety disorder 04/26/2015  . Abnormal cervical cytology 11/30/2014  . Colon polyp 11/30/2014  . Coitalgia 11/30/2014  . Endometriosis 11/30/2014  . Family history of cardiovascular disease 11/30/2014  . Acid reflux 11/30/2014  . Cannot sleep 11/30/2014  . Headache, migraine 11/30/2014  . Loss of feeling or sensation 11/30/2014  . Subclinical hypothyroidism 11/30/2014  . Current tobacco use 11/30/2014  . Head revolving around 11/30/2014  . Avitaminosis D 11/30/2014  . HYPERSOMNIA 08/03/2007  . DYSPNEA ON EXERTION 07/16/2007  . ABNORMAL PULMONARY TEST RESULTS 07/16/2007  . ASTHMA 06/22/2007    Past Surgical History:  Procedure Laterality Date  . ABDOMINAL HYSTERECTOMY  2004  . APPENDECTOMY  2004  . CERVICAL CONIZATION W/BX  11/1995  . CESAREAN SECTION      x2  . laposcophy  05/1996  . TONSILLECTOMY    . TUBAL LIGATION    . WISDOM TOOTH EXTRACTION     x 4     Past Surgical History:  Procedure Laterality Date  . ABDOMINAL HYSTERECTOMY  2004  . APPENDECTOMY  2004  . CERVICAL CONIZATION W/BX  11/1995  . CESAREAN SECTION      x2  . laposcophy  05/1996  . TONSILLECTOMY    . TUBAL LIGATION    . WISDOM TOOTH EXTRACTION     x 4    Current Outpatient Rx  . Order #: [id number] Class: Historical Med  . Order #: [id number] Class: Normal  . Order #: [id number] Class: Historical Med  . Order #: [id number] Class: Print  . Order #: [id number] Class: Print  . Order #: [id number] Class: OTC  . Order #: [id number] Class: Normal  . Order #: [id number] Class: Historical Med  . Order #: [id number] Class: Historical Med  . Order #: [id number] Class: Historical Med  . Order #: [id number] Class: Normal    Allergies:  Cough-cold medicine  [phenir-pe-sod sal-caff cit]; Erythromycin; Levofloxacin; Sulfonamide derivatives; Sulfur; Tetracyclines & related; and Tramadol  Family History: Family History  Problem Relation Age of Onset  . Adopted: Yes  . Arthritis Mother   . Depression Mother   . Hypertension Mother   . Heart disease Mother     CHF  . Kidney disease Mother   . Diabetes Father   . Depression Sister   . Cancer Brother     colon, lungs, liver  . Diabetes Paternal Grandmother     Social History: Social History  Substance Use  Topics  . Smoking status: Former Smoker    Packs/day: 1.00    Years: 9.00    Types: Cigarettes    Quit date: 12/28/1990  . Smokeless tobacco: Never Used     Comment: quit 1992  . Alcohol use Yes     Comment: occasion "rare"     Review of Systems:   10 point review of systems was performed and was otherwise negative:  Constitutional: No fever Eyes: No visual disturbances ENT: No sore throat, ear pain Cardiac: No chest pain Respiratory: No shortness of breath, wheezing, or stridor Abdomen: No abdominal pain, no vomiting, No diarrhea Endocrine: No weight loss, No night sweats Extremities: No peripheral edema,  cyanosis Skin: No rashes, easy bruising Neurologic: No focal weakness, trouble with speech or swollowing Urologic: No dysuria, Hematuria, or urinary frequency   Physical Exam:  ED Triage Vitals  Enc Vitals Group     BP 04/23/16 1759 135/72     Pulse Rate 04/23/16 1759 66     Resp 04/23/16 1759 17     Temp 04/23/16 1759 98 F (36.7 C)     Temp Source 04/23/16 1759 Oral     SpO2 04/23/16 1759 100 %     Weight 04/23/16 1800 160 lb (72.6 kg)     Height 04/23/16 1800 4\' 11"  (1.499 m)     Head Circumference --      Peak Flow --      Pain Score 04/23/16 1800 5     Pain Loc --      Pain Edu? --      Excl. in GC? --     General: Awake , Alert , and Oriented times 3; GCS 15 Head: Normal cephalic , atraumatic Eyes: Pupils equal , round, reactive to light Nose/Throat: No nasal drainage, patent upper airway without erythema or exudate.  Neck: Patient has mostly right-sided. Muscles spinal pain radiating up to the right occiput Lungs: Clear to ascultation without wheezes , rhonchi, or rales Heart: Regular rate, regular rhythm without murmurs , gallops , or rubs Abdomen: Soft, non tender without rebound, guarding , or rigidity; bowel sounds positive and symmetric in all 4 quadrants. No organomegaly .        Extremities: Right shoulder shows full range of motion with good flexion extension supination and pronation lateral arm raise all being comfortable for the patient without any focal musculoskeletal or neurologic abnormalities 2 plus symmetric pulses. No edema, clubbing or cyanosis Neurologic: normal ambulation, Motor symmetric without deficits, sensory intact Skin: warm, dry, no rashes    Radiology: * "Ct Head Wo Contrast  Result Date: 04/23/2016 CLINICAL DATA:  Trip and fall several hours ago with persistent headaches and neck pain, initial encounter EXAM: CT HEAD WITHOUT CONTRAST CT CERVICAL SPINE WITHOUT CONTRAST TECHNIQUE: Multidetector CT imaging of the head and cervical spine  was performed following the standard protocol without intravenous contrast. Multiplanar CT image reconstructions of the cervical spine were also generated. COMPARISON:  None. FINDINGS: CT HEAD FINDINGS Brain: No evidence of acute infarction, hemorrhage, hydrocephalus, extra-axial collection or mass lesion/mass effect. Vascular: No hyperdense vessel or unexpected calcification. Skull: Normal. Negative for fracture or focal lesion. Sinuses/Orbits: No acute finding. Other: None CT CERVICAL SPINE FINDINGS Alignment: Loss of the normal cervical lordosis is noted. This may be related to muscular spasm. Skull base and vertebrae: No acute fracture. No primary bone lesion or focal pathologic process. Soft tissues and spinal canal: No prevertebral fluid or swelling. No visible canal hematoma. Disc  levels: No acute disc pathology is noted. Disc space narrowing is seen at C6-7 with mild osteophytic changes. Upper chest: Negative. IMPRESSION: CT of the head:  No acute intracranial abnormality noted. CT of cervical spine: Loss of normal cervical lordosis which may be related to muscular spasm. No acute bony abnormality is seen. Electronically Signed   By: Alcide Clever M.D.   On: 04/23/2016 19:28   Ct Cervical Spine Wo Contrast  Result Date: 04/23/2016 CLINICAL DATA:  Trip and fall several hours ago with persistent headaches and neck pain, initial encounter EXAM: CT HEAD WITHOUT CONTRAST CT CERVICAL SPINE WITHOUT CONTRAST TECHNIQUE: Multidetector CT imaging of the head and cervical spine was performed following the standard protocol without intravenous contrast. Multiplanar CT image reconstructions of the cervical spine were also generated. COMPARISON:  None. FINDINGS: CT HEAD FINDINGS Brain: No evidence of acute infarction, hemorrhage, hydrocephalus, extra-axial collection or mass lesion/mass effect. Vascular: No hyperdense vessel or unexpected calcification. Skull: Normal. Negative for fracture or focal lesion.  Sinuses/Orbits: No acute finding. Other: None CT CERVICAL SPINE FINDINGS Alignment: Loss of the normal cervical lordosis is noted. This may be related to muscular spasm. Skull base and vertebrae: No acute fracture. No primary bone lesion or focal pathologic process. Soft tissues and spinal canal: No prevertebral fluid or swelling. No visible canal hematoma. Disc levels: No acute disc pathology is noted. Disc space narrowing is seen at C6-7 with mild osteophytic changes. Upper chest: Negative. IMPRESSION: CT of the head:  No acute intracranial abnormality noted. CT of cervical spine: Loss of normal cervical lordosis which may be related to muscular spasm. No acute bony abnormality is seen. Electronically Signed   By: Alcide Clever M.D.   On: 04/23/2016 19:28  "  I personally reviewed the radiologic studies     ED Course:  Patient's stay here was uneventful and she does not appear to have suffered any significant head injury or neck abnormalities seen by CT evaluation of the muscle spasm which would be consistent with her clinical presentation. Patient will be prescribed Ativan as a muscle relaxant along with ibuprofen for pain. Her headache is most likely a muscle tension headache and unlikely to be a concussion or posttraumatic injury Clinical Course     Assessment:  Cervical strain  Final Clinical Impression:   Final diagnoses:  Cervical strain, acute, initial encounter     Plan: * Outpatient " New Prescriptions   IBUPROFEN (ADVIL,MOTRIN) 800 MG TABLET    Take 1 tablet (800 mg total) by mouth every 8 (eight) hours as needed.   LORAZEPAM (ATIVAN) 1 MG TABLET    Take 1 tablet (1 mg total) by mouth 2 (two) times daily as needed (muscle relaxation).  "  Patient was advised to return immediately if condition worsens. Patient was advised to follow up with their primary care physician or other specialized physicians involved in their outpatient care. The patient and/or family member/power of  attorney had laboratory results reviewed at the bedside. All questions and concerns were addressed and appropriate discharge instructions were distributed by the nursing staff.            Jennye Moccasin, MD 04/23/16 2001

## 2016-04-23 NOTE — Discharge Instructions (Signed)
Please return immediately if condition worsens. Please contact her primary physician or the physician you were given for referral. If you have any specialist physicians involved in her treatment and plan please also contact them. Thank you for using [facility]. ° °

## 2016-04-23 NOTE — ED Notes (Signed)
Pt returned from CT °

## 2016-04-23 NOTE — ED Triage Notes (Signed)
Pt reports tripping and falling around 1400 today; fell on right side. Pt reports she hit her head on purse on ground. Pt also reports right arm pain, reports heaviness. Pt reports neck pain and upper back pain.

## 2016-06-10 ENCOUNTER — Other Ambulatory Visit
Admission: RE | Admit: 2016-06-10 | Discharge: 2016-06-10 | Disposition: A | Discharge status: Home / Self Care | Payer: BLUE CROSS/BLUE SHIELD | Source: Ambulatory Visit | Attending: Unknown Physician Specialty | Admitting: Unknown Physician Specialty

## 2016-06-10 ENCOUNTER — Other Ambulatory Visit: Payer: Self-pay | Admitting: Unknown Physician Specialty

## 2016-06-10 DIAGNOSIS — E041 Nontoxic single thyroid nodule: Secondary | ICD-10-CM

## 2016-06-10 LAB — TSH: TSH: 4.73 u[IU]/mL — AB (ref 0.350–4.500)

## 2016-06-13 ENCOUNTER — Ambulatory Visit
Admission: RE | Admit: 2016-06-13 | Discharge: 2016-06-13 | Disposition: A | Discharge status: Home / Self Care | Payer: BLUE CROSS/BLUE SHIELD | Source: Ambulatory Visit | Attending: Unknown Physician Specialty | Admitting: Unknown Physician Specialty

## 2016-06-13 DIAGNOSIS — E041 Nontoxic single thyroid nodule: Secondary | ICD-10-CM | POA: Diagnosis not present

## 2016-06-25 ENCOUNTER — Ambulatory Visit (INDEPENDENT_AMBULATORY_CARE_PROVIDER_SITE_OTHER): Payer: BLUE CROSS/BLUE SHIELD | Admitting: Physician Assistant

## 2016-06-25 ENCOUNTER — Encounter: Payer: Self-pay | Admitting: Physician Assistant

## 2016-06-25 VITALS — BP 120/60 | HR 100 | Temp 100.2°F | Resp 18 | Wt 165.8 lb

## 2016-06-25 DIAGNOSIS — R6889 Other general symptoms and signs: Secondary | ICD-10-CM

## 2016-06-25 DIAGNOSIS — J029 Acute pharyngitis, unspecified: Secondary | ICD-10-CM

## 2016-06-25 DIAGNOSIS — J069 Acute upper respiratory infection, unspecified: Secondary | ICD-10-CM | POA: Diagnosis not present

## 2016-06-25 LAB — POCT INFLUENZA A/B
INFLUENZA A, POC: NEGATIVE
INFLUENZA B, POC: NEGATIVE

## 2016-06-25 LAB — POCT RAPID STREP A (OFFICE): RAPID STREP A SCREEN: NEGATIVE

## 2016-06-25 MED ORDER — AMOXICILLIN-POT CLAVULANATE 875-125 MG PO TABS: 1.0000 | ORAL_TABLET | Freq: Two times a day (BID) | ORAL | 0 refills | Status: DC

## 2016-06-25 NOTE — Progress Notes (Signed)
Patient: Hannah [name] Female    DOB: 04/24/1966   50 y.o.   MRN: [id number] Visit Date: 06/25/2016  Today's Provider: Margaretann LovelessJennifer M Burnette, PA-C   Chief Complaint  Patient presents with  . URI   Subjective:    URI   This is a new problem. The current episode started in the past 7 days (Started Christmas day). The problem has been gradually worsening. The maximum temperature recorded prior to her arrival was 102 - 102.9 F (this morning temperature was 102.0). The fever has been present for 1 to 2 days. Associated symptoms include congestion, coughing, ear pain (fullness), headaches and a sore throat. Pertinent negatives include no abdominal pain, chest pain, rhinorrhea, sneezing or wheezing. Associated symptoms comments: Body ache. She has tried NSAIDs (Ibuprofen this AM) for the symptoms. The treatment provided no relief.  Husband had been sick last week but never had a fever.     Allergies  Allergen Reactions  . Cough-Cold Medicine  [Phenir-Pe-Sod Sal-Caff Cit] Itching  . Erythromycin   . Levofloxacin     headache, chest discomfort, insomnia  . Sulfonamide Derivatives   . Sulfur   . Tetracyclines & Related   . Tramadol     Lips and mouth numbness.     Current Outpatient Prescriptions:  .  ibuprofen (ADVIL,MOTRIN) 800 MG tablet, Take 1 tablet (800 mg total) by mouth every 8 (eight) hours as needed., Disp: 30 tablet, Rfl: 0 .  montelukast (SINGULAIR) 10 MG tablet, Take by mouth. Reported on 11/02/2015, Disp: , Rfl:  .  Beclomethasone Dipropionate 80 MCG/ACT AERS, Place into the nose. Reported on 11/02/2015, Disp: , Rfl:  .  busPIRone (BUSPAR) 10 MG tablet, TAKE 1 TABLET (10 MG TOTAL) BY MOUTH 2 (TWO) TIMES DAILY. (Patient not taking: Reported on 06/25/2016), Disp: 60 tablet, Rfl: 1 .  fexofenadine (ALLEGRA) 180 MG tablet, Take by mouth. Reported on 11/02/2015, Disp: , Rfl:  .  LORazepam (ATIVAN) 1 MG tablet, Take 1 tablet (1 mg total) by mouth 2 (two) times daily as needed  (muscle relaxation). (Patient not taking: Reported on 06/25/2016), Disp: 10 tablet, Rfl: 0 .  Melatonin 2.5 MG CAPS, Take 1 capsule (2.5 mg total) by mouth at bedtime. (Patient not taking: Reported on 06/25/2016), Disp: 30 each, Rfl: 0 .  meloxicam (MOBIC) 15 MG tablet, Take 1 tablet (15 mg total) by mouth daily. (Patient not taking: Reported on 06/25/2016), Disp: 30 tablet, Rfl: 2 .  Mometasone Furo-Formoterol Fum (DULERA IN), Inhale into the lungs. Reported on 11/02/2015, Disp: , Rfl:  .  PROAIR HFA 108 (90 BASE) MCG/ACT inhaler, Reported on 11/02/2015, Disp: , Rfl: 0 .  QVAR 80 MCG/ACT inhaler, 2 PUFFS TWICE A DAY, Disp: , Rfl: 0 .  topiramate (TOPAMAX) 200 MG tablet, TAKE 1 TABLET (200 MG TOTAL) BY MOUTH DAILY. (Patient not taking: Reported on 06/25/2016), Disp: 30 tablet, Rfl: 1  Review of Systems  Constitutional: Positive for chills, fatigue and fever.  HENT: Positive for congestion, ear pain (fullness), postnasal drip and sore throat. Negative for rhinorrhea and sneezing.   Respiratory: Positive for cough, chest tightness and shortness of breath. Negative for wheezing.   Cardiovascular: Negative for chest pain, palpitations and leg swelling.  Gastrointestinal: Negative for abdominal pain.  Neurological: Positive for headaches. Negative for dizziness.    Social History  Substance Use Topics  . Smoking status: Former Smoker    Packs/day: 1.00    Years: 9.00    Types: Cigarettes  Quit date: 12/28/1990  . Smokeless tobacco: Never Used     Comment: quit 1992  . Alcohol use Yes     Comment: occasion "rare"   Objective:   BP 120/60 (BP Location: Right Arm, Patient Position: Sitting, Cuff Size: Normal)   Pulse 100   Temp 100.2 F (37.9 C) (Oral)   Resp 18   Wt 165 lb 12.8 oz (75.2 kg)   SpO2 97%   BMI 33.49 kg/m   Physical Exam  Constitutional: She appears well-developed and well-nourished. No distress.  HENT:  Head: Normocephalic and atraumatic.  Right Ear: Hearing, external  ear and ear canal normal. A middle ear effusion is present.  Left Ear: Hearing, tympanic membrane, external ear and ear canal normal.  Nose: Mucosal edema and rhinorrhea present. Right sinus exhibits no maxillary sinus tenderness and no frontal sinus tenderness. Left sinus exhibits no maxillary sinus tenderness and no frontal sinus tenderness.  Mouth/Throat: Uvula is midline, oropharynx is clear and moist and mucous membranes are normal. No oropharyngeal exudate.  Eyes: Conjunctivae are normal. Pupils are equal, round, and reactive to light. Right eye exhibits no discharge. Left eye exhibits no discharge. No scleral icterus.  Neck: Normal range of motion. Neck supple. No tracheal deviation present. No thyromegaly present.  Cardiovascular: Normal rate, regular rhythm and normal heart sounds.  Exam reveals no gallop and no friction rub.   No murmur heard. Pulmonary/Chest: Effort normal and breath sounds normal. No stridor. No respiratory distress. She has no wheezes. She has no rales.  Lymphadenopathy:    She has no cervical adenopathy.  Skin: Skin is warm and dry. She is not diaphoretic.  Vitals reviewed.      Assessment & Plan:     1. Upper respiratory tract infection, unspecified type Worsening with high fever. Will treat with augmentin as below. May alternate IBU and tylenol for fever and body aches. Use albuterol inhaler as needed. She is to call the office if symptoms worsen or fail to improve.  - amoxicillin-clavulanate (AUGMENTIN) 875-125 MG tablet; Take 1 tablet by mouth 2 (two) times daily.  Dispense: 20 tablet; Refill: 0  2. Flu-like symptoms Flu test in office was negative.  - POCT Influenza A/B  3. Sore throat Rapid strep test was negative.  - POCT rapid strep A  The entirety of the information documented in the History of Present Illness, Review of Systems and Physical Exam were personally obtained by me. Portions of this information were initially documented by Hetty ElyJoseline  Rosas, CMA and reviewed by me for thoroughness and accuracy.      Margaretann LovelessJennifer M Burnette, PA-C  [facility]

## 2016-06-25 NOTE — Patient Instructions (Signed)
Upper Respiratory Infection, Adult Most upper respiratory infections (URIs) are a viral infection of the air passages leading to the lungs. A URI affects the nose, throat, and upper air passages. The most common type of URI is nasopharyngitis and is typically referred to as "the common cold." URIs run their course and usually go away on their own. Most of the time, a URI does not require medical attention, but sometimes a bacterial infection in the upper airways can follow a viral infection. This is called a secondary infection. Sinus and middle ear infections are common types of secondary upper respiratory infections. Bacterial pneumonia can also complicate a URI. A URI can worsen asthma and chronic obstructive pulmonary disease (COPD). Sometimes, these complications can require emergency medical care and may be life threatening. What are the causes? Almost all URIs are caused by viruses. A virus is a type of germ and can spread from one person to another. What increases the risk? You may be at risk for a URI if:  You smoke.  You have chronic heart or lung disease.  You have a weakened defense (immune) system.  You are very young or very old.  You have nasal allergies or asthma.  You work in crowded or poorly ventilated areas.  You work in health care facilities or schools.  What are the signs or symptoms? Symptoms typically develop 2-3 days after you come in contact with a cold virus. Most viral URIs last 7-10 days. However, viral URIs from the influenza virus (flu virus) can last 14-18 days and are typically more severe. Symptoms may include:  Runny or stuffy (congested) nose.  Sneezing.  Cough.  Sore throat.  Headache.  Fatigue.  Fever.  Loss of appetite.  Pain in your forehead, behind your eyes, and over your cheekbones (sinus pain).  Muscle aches.  How is this diagnosed? Your health care provider may diagnose a URI by:  Physical exam.  Tests to check that your  symptoms are not due to another condition such as: ? Strep throat. ? Sinusitis. ? Pneumonia. ? Asthma.  How is this treated? A URI goes away on its own with time. It cannot be cured with medicines, but medicines may be prescribed or recommended to relieve symptoms. Medicines may help:  Reduce your fever.  Reduce your cough.  Relieve nasal congestion.  Follow these instructions at home:  Take medicines only as directed by your health care provider.  Gargle warm saltwater or take cough drops to comfort your throat as directed by your health care provider.  Use a warm mist humidifier or inhale steam from a shower to increase air moisture. This may make it easier to breathe.  Drink enough fluid to keep your urine clear or pale yellow.  Eat soups and other clear broths and maintain good nutrition.  Rest as needed.  Return to work when your temperature has returned to normal or as your health care provider advises. You may need to stay home longer to avoid infecting others. You can also use a face mask and careful hand washing to prevent spread of the virus.  Increase the usage of your inhaler if you have asthma.  Do not use any tobacco products, including cigarettes, chewing tobacco, or electronic cigarettes. If you need help quitting, ask your health care provider. How is this prevented? The best way to protect yourself from getting a cold is to practice good hygiene.  Avoid oral or hand contact with people with cold symptoms.  Wash your   hands often if contact occurs.  There is no clear evidence that vitamin C, vitamin E, echinacea, or exercise reduces the chance of developing a cold. However, it is always recommended to get plenty of rest, exercise, and practice good nutrition. Contact a health care provider if:  You are getting worse rather than better.  Your symptoms are not controlled by medicine.  You have chills.  You have worsening shortness of breath.  You have  brown or red mucus.  You have yellow or brown nasal discharge.  You have pain in your face, especially when you bend forward.  You have a fever.  You have swollen neck glands.  You have pain while swallowing.  You have white areas in the back of your throat. Get help right away if:  You have severe or persistent: ? Headache. ? Ear pain. ? Sinus pain. ? Chest pain.  You have chronic lung disease and any of the following: ? Wheezing. ? Prolonged cough. ? Coughing up blood. ? A change in your usual mucus.  You have a stiff neck.  You have changes in your: ? Vision. ? Hearing. ? Thinking. ? Mood. This information is not intended to replace advice given to you by your health care provider. Make sure you discuss any questions you have with your health care provider. Document Released: 12/10/2000 Document Revised: 02/17/2016 Document Reviewed: 09/21/2013 Elsevier Interactive Patient Education  2017 Elsevier Inc.  

## 2016-06-26 ENCOUNTER — Telehealth: Payer: Self-pay | Admitting: Physician Assistant

## 2016-06-26 MED ORDER — PREDNISONE 10 MG (21) PO TBPK: ORAL_TABLET | ORAL | 0 refills | Status: DC

## 2016-06-26 MED ORDER — BENZONATATE 200 MG PO CAPS: 200.0000 mg | ORAL_CAPSULE | Freq: Three times a day (TID) | ORAL | 0 refills | Status: DC | PRN

## 2016-06-26 NOTE — Telephone Encounter (Signed)
[facility]essalon perles [facility] [address]

## 2016-06-26 NOTE — Telephone Encounter (Signed)
Pt advised. Emily Drozdowski, CMA  

## 2016-06-26 NOTE — Telephone Encounter (Signed)
Was started on Augmentin. Please advise. Allene DillonEmily Drozdowski, CMA

## 2016-06-26 NOTE — Telephone Encounter (Signed)
Pt states she was seen yesterday.  Pt is requesting a Rx to help with coughing.  Pt is also requesting a Rx for prednisone to help with this.  [facility] [address]  CB#[phone]/MW

## 2016-06-27 ENCOUNTER — Telehealth: Payer: Self-pay | Admitting: Physician Assistant

## 2016-06-27 DIAGNOSIS — R05 Cough: Secondary | ICD-10-CM

## 2016-06-27 DIAGNOSIS — R059 Cough, unspecified: Secondary | ICD-10-CM

## 2016-06-27 MED ORDER — PROMETHAZINE-DM 6.25-15 MG/5ML PO SYRP: 5.0000 mL | ORAL_SOLUTION | Freq: Four times a day (QID) | ORAL | 0 refills | Status: DC | PRN

## 2016-06-27 NOTE — Telephone Encounter (Signed)
Please review.  Thanks,  -Joseline 

## 2016-06-27 NOTE — Telephone Encounter (Signed)
Pt states she is still coughing during the night and during the day.  Pt is requesting a Rx to help with this cough.  [facility] [address]  CB#[phone]/MW

## 2016-06-27 NOTE — Telephone Encounter (Signed)
Patient advised as below.  

## 2016-06-27 NOTE — Telephone Encounter (Signed)
Will send promethazine dm for cough. This may make her drowsy. [facility]

## 2016-07-01 ENCOUNTER — Ambulatory Visit
Admission: RE | Admit: 2016-07-01 | Discharge: 2016-07-01 | Disposition: A | Discharge status: Home / Self Care | Payer: BLUE CROSS/BLUE SHIELD | Source: Ambulatory Visit | Attending: Allergy and Immunology | Admitting: Allergy and Immunology

## 2016-07-01 ENCOUNTER — Other Ambulatory Visit: Payer: Self-pay | Admitting: Allergy and Immunology

## 2016-07-01 DIAGNOSIS — J209 Acute bronchitis, unspecified: Secondary | ICD-10-CM

## 2016-07-03 ENCOUNTER — Telehealth: Payer: Self-pay

## 2016-07-03 NOTE — Telephone Encounter (Signed)
Pt called c/o SOB and fatigue. No swelling or H/O heart dx. Has been treated for bronchitis by pulmonology. Started Z Pak, prednisone, was given breathing tx, is taking inhaler. Still not feeling better. Pt reports she is not feeling bad enough to go to ED tonight. Wants to wait for 0900 appt tomorrow. Advised pt to go to ED if sx worsen. Allene DillonEmily Drozdowski, CMA

## 2016-07-04 ENCOUNTER — Encounter: Payer: Self-pay | Admitting: Physician Assistant

## 2016-07-04 ENCOUNTER — Ambulatory Visit (INDEPENDENT_AMBULATORY_CARE_PROVIDER_SITE_OTHER): Payer: BLUE CROSS/BLUE SHIELD | Admitting: Physician Assistant

## 2016-07-04 VITALS — BP 110/70 | HR 76 | Temp 98.2°F | Resp 16 | Wt 165.0 lb

## 2016-07-04 DIAGNOSIS — K219 Gastro-esophageal reflux disease without esophagitis: Secondary | ICD-10-CM | POA: Diagnosis not present

## 2016-07-04 DIAGNOSIS — R5383 Other fatigue: Secondary | ICD-10-CM | POA: Diagnosis not present

## 2016-07-04 DIAGNOSIS — R0602 Shortness of breath: Secondary | ICD-10-CM | POA: Diagnosis not present

## 2016-07-04 DIAGNOSIS — R05 Cough: Secondary | ICD-10-CM | POA: Diagnosis not present

## 2016-07-04 DIAGNOSIS — E559 Vitamin D deficiency, unspecified: Secondary | ICD-10-CM

## 2016-07-04 DIAGNOSIS — R059 Cough, unspecified: Secondary | ICD-10-CM

## 2016-07-04 MED ORDER — HYDROCOD POLST-CPM POLST ER 10-8 MG/5ML PO SUER: 5.0000 mL | Freq: Two times a day (BID) | ORAL | 0 refills | Status: DC | PRN

## 2016-07-04 MED ORDER — OMEPRAZOLE 20 MG PO CPDR: 20.0000 mg | DELAYED_RELEASE_CAPSULE | Freq: Every day | ORAL | 1 refills | Status: DC

## 2016-07-04 NOTE — Progress Notes (Signed)
Patient: Hannah RaddleKelly B Capes Female    DOB: 11/03/1965   51 y.o.   MRN: [id number] Visit Date: 07/04/2016  Today's Provider: Margaretann LovelessJennifer M Burnette, PA-C   Chief Complaint  Patient presents with  . URI   Subjective:    HPI Patient c/o dry cough, shortness of breath and fatigue. Patient was last seen on 06/25/2016 and was started on Augmentin with prednisone and tessalon perles. Patient reports she has seen Dr. Gary FleetWhalen at [facility] on 07/01/16 and was started on Azithromycin and IM methylprednisolone. Also given prednisone taper. She was given a spirometry test which was normal, CXR was normal. Patient was also started on Dulera 2 puffs BID patient reports good compliance. Patient reports using Pro air inhaler as needed.      Allergies  Allergen Reactions  . Cough-Cold Medicine  [Phenir-Pe-Sod Sal-Caff Cit] Itching  . Erythromycin   . Levofloxacin     headache, chest discomfort, insomnia  . Sulfonamide Derivatives   . Sulfur   . Tetracyclines & Related   . Tramadol     Lips and mouth numbness.     Current Outpatient Prescriptions:  .  azithromycin (ZITHROMAX) 250 MG tablet, Take 250 mg by mouth once., Disp: , Rfl:  .  Beclomethasone Dipropionate 80 MCG/ACT AERS, Place into the nose. Reported on 11/02/2015, Disp: , Rfl:  .  busPIRone (BUSPAR) 10 MG tablet, TAKE 1 TABLET (10 MG TOTAL) BY MOUTH 2 (TWO) TIMES DAILY., Disp: 60 tablet, Rfl: 1 .  fexofenadine (ALLEGRA) 180 MG tablet, Take by mouth. Reported on 11/02/2015, Disp: , Rfl:  .  ibuprofen (ADVIL,MOTRIN) 800 MG tablet, Take 1 tablet (800 mg total) by mouth every 8 (eight) hours as needed., Disp: 30 tablet, Rfl: 0 .  LORazepam (ATIVAN) 1 MG tablet, Take 1 tablet (1 mg total) by mouth 2 (two) times daily as needed (muscle relaxation)., Disp: 10 tablet, Rfl: 0 .  Melatonin 2.5 MG CAPS, Take 1 capsule (2.5 mg total) by mouth at bedtime., Disp: 30 each, Rfl: 0 .  meloxicam (MOBIC) 15 MG tablet, Take 1 tablet (15 mg total)  by mouth daily., Disp: 30 tablet, Rfl: 2 .  Mometasone Furo-Formoterol Fum (DULERA IN), Inhale into the lungs. Reported on 11/02/2015, Disp: , Rfl:  .  montelukast (SINGULAIR) 10 MG tablet, Take by mouth. Reported on 11/02/2015, Disp: , Rfl:  .  predniSONE (STERAPRED UNI-PAK 21 TAB) 10 MG (21) TBPK tablet, Take as directed on package instructions., Disp: 21 tablet, Rfl: 0 .  PROAIR HFA 108 (90 BASE) MCG/ACT inhaler, Reported on 11/02/2015, Disp: , Rfl: 0 .  topiramate (TOPAMAX) 200 MG tablet, TAKE 1 TABLET (200 MG TOTAL) BY MOUTH DAILY., Disp: 30 tablet, Rfl: 1  Review of Systems  Constitutional: Positive for appetite change (no appetite) and fatigue.  HENT: Negative.   Respiratory: Positive for cough, chest tightness, shortness of breath and wheezing.   Cardiovascular: Negative.   Gastrointestinal: Positive for nausea. Negative for abdominal pain and vomiting.  Neurological: Negative for dizziness and headaches.    Social History  Substance Use Topics  . Smoking status: Former Smoker    Packs/day: 1.00    Years: 9.00    Types: Cigarettes    Quit date: 12/28/1990  . Smokeless tobacco: Never Used     Comment: quit 1992  . Alcohol use Yes     Comment: occasion "rare"   Objective:   BP 110/70 (BP Location: Right Arm, Patient Position: Sitting, Cuff Size:  Large)   Pulse 76   Temp 98.2 F (36.8 C) (Oral)   Resp 16   Wt 165 lb (74.8 kg)   SpO2 97%   BMI 33.33 kg/m   Physical Exam  Constitutional: She appears well-developed and well-nourished. No distress.  HENT:  Head: Normocephalic and atraumatic.  Right Ear: Hearing, external ear and ear canal normal. Tympanic membrane is not erythematous and not bulging. A middle ear effusion is present.  Left Ear: Hearing, external ear and ear canal normal. Tympanic membrane is not erythematous and not bulging. A middle ear effusion is present.  Nose: Nose normal. No mucosal edema or rhinorrhea. Right sinus exhibits no maxillary sinus tenderness  and no frontal sinus tenderness. Left sinus exhibits no maxillary sinus tenderness and no frontal sinus tenderness.  Mouth/Throat: Uvula is midline, oropharynx is clear and moist and mucous membranes are normal. No oropharyngeal exudate, posterior oropharyngeal edema or posterior oropharyngeal erythema.  TM dull bilaterally  Eyes: Conjunctivae are normal. Pupils are equal, round, and reactive to light. Right eye exhibits no discharge. Left eye exhibits no discharge. No scleral icterus.  Neck: Normal range of motion. Neck supple. No tracheal deviation present. No thyromegaly present.  Cardiovascular: Normal rate, regular rhythm and normal heart sounds.  Exam reveals no gallop and no friction rub.   No murmur heard. Pulmonary/Chest: Effort normal and breath sounds normal. No stridor. No respiratory distress. She has no wheezes. She has no rales.  Lymphadenopathy:    She has no cervical adenopathy.  Skin: Skin is warm and dry. She is not diaphoretic.  Vitals reviewed.     Assessment & Plan:     1. Fatigue, unspecified type Will check labs as below since she is not responding to multiple steroid treatments and antibiotics. Will follow up pending lab results.  - CBC w/Diff/Platelet - Epstein-Barr Virus VCA Antibody Panel - Vitamin D (25 hydroxy) - Thyroid Panel With TSH  2. SOB (shortness of breath) See above medical treatment plan. - CBC w/Diff/Platelet - Epstein-Barr Virus VCA Antibody Panel  3. Cough Has not responded to OTC treatments, tessalon perles, or promethazine dm.  - chlorpheniramine-HYDROcodone (TUSSIONEX PENNKINETIC ER) 10-8 MG/5ML SUER; Take 5 mLs by mouth every 12 (twelve) hours as needed for cough.  Dispense: 140 mL; Refill: 0  4. Gastroesophageal reflux disease, esophagitis presence not specified Being that cough, chest tightness, SOB and fatigue have not responded to multiple treatments this may be secondary to uncontrolled reflux. She does have a h/o this and has been  using an essential oil (digestease) for symptoms. She has had increasing indigestion as well. Will start prilosec as below and see if symptoms improve with treatment. She is to call if no improvements.  - omeprazole (PRILOSEC) 20 MG capsule; Take 1 capsule (20 mg total) by mouth daily.  Dispense: 30 capsule; Refill: 1       Margaretann Loveless, PA-C  [facility]

## 2016-07-07 ENCOUNTER — Telehealth: Payer: Self-pay | Admitting: Physician Assistant

## 2016-07-07 LAB — EPSTEIN-BARR VIRUS VCA ANTIBODY PANEL
EBV Early Antigen Ab, IgG: 113 U/mL — ABNORMAL HIGH (ref 0.0–8.9)
EBV NA IGG: 145 U/mL — AB (ref 0.0–17.9)
EBV VCA IgG: >600 U/mL — ABNORMAL HIGH (ref 0.0–17.9)
EBV VCA IgM: <36 U/mL (ref 0.0–35.9)

## 2016-07-07 LAB — CBC WITH DIFFERENTIAL/PLATELET
BASOS: 0 %
Basophils Absolute: 0 10*3/uL (ref 0.0–0.2)
EOS (ABSOLUTE): 0.1 10*3/uL (ref 0.0–0.4)
Eos: 1 %
HEMOGLOBIN: 13.5 g/dL (ref 11.1–15.9)
Hematocrit: 42.4 % (ref 34.0–46.6)
IMMATURE GRANS (ABS): 0.3 10*3/uL — AB (ref 0.0–0.1)
Immature Granulocytes: 2 %
LYMPHS ABS: 3.3 10*3/uL — AB (ref 0.7–3.1)
LYMPHS: 23 %
MCH: 27.1 pg (ref 26.6–33.0)
MCHC: 31.8 g/dL (ref 31.5–35.7)
MCV: 85 fL (ref 79–97)
MONOCYTES: 7 %
Monocytes Absolute: 1 10*3/uL — ABNORMAL HIGH (ref 0.1–0.9)
Neutrophils Absolute: 9.9 10*3/uL — ABNORMAL HIGH (ref 1.4–7.0)
Neutrophils: 67 %
Platelets: 306 10*3/uL (ref 150–379)
RBC: 4.98 x10E6/uL (ref 3.77–5.28)
RDW: 13.6 % (ref 12.3–15.4)
WBC: 14.6 10*3/uL — ABNORMAL HIGH (ref 3.4–10.8)

## 2016-07-07 LAB — THYROID PANEL WITH TSH
Free Thyroxine Index: 1.9 (ref 1.2–4.9)
T3 Uptake Ratio: 25 % (ref 24–39)
T4 TOTAL: 7.4 ug/dL (ref 4.5–12.0)
TSH: 2.78 u[IU]/mL (ref 0.450–4.500)

## 2016-07-07 LAB — VITAMIN D 25 HYDROXY (VIT D DEFICIENCY, FRACTURES): Vit D, 25-Hydroxy: 16.8 ng/mL — ABNORMAL LOW (ref 30.0–100.0)

## 2016-07-07 MED ORDER — VITAMIN D (ERGOCALCIFEROL) 1.25 MG (50000 UNIT) PO CAPS: 50000.0000 [IU] | ORAL_CAPSULE | ORAL | 4 refills | Status: DC

## 2016-07-07 NOTE — Addendum Note (Signed)
Addended by: Margaretann LovelessBURNETTE, JENNIFER M on: 07/07/2016 09:27 AM   Modules accepted: Orders

## 2016-07-07 NOTE — Telephone Encounter (Signed)
Pt stated that she saw results of her labs that were done on 07/04/16 on MyChart but she would like someone to call her to explain the results. Please advise. Thanks TNP

## 2016-07-07 NOTE — Telephone Encounter (Signed)
-----   Message from Margaretann LovelessJennifer M Burnette, PA-C sent at 07/07/2016  9:26 AM EST ----- Labs show possible reactivated mono but also Vit D level is low. Will send Rx for Vit D supplementation to take once weekly. Mono just treat symptoms as it is viral and try to rest as much as possible.

## 2016-07-07 NOTE — Telephone Encounter (Signed)
Patient advised as below. Patient verbalizes understanding and is in agreement with treatment plan.  

## 2016-08-27 ENCOUNTER — Other Ambulatory Visit: Payer: Self-pay | Admitting: Physician Assistant

## 2016-08-27 DIAGNOSIS — K219 Gastro-esophageal reflux disease without esophagitis: Secondary | ICD-10-CM

## 2016-08-27 NOTE — Telephone Encounter (Signed)
Last ov 06/25/16 Last filled 07/04/16 with 1 refill Please review. Thank you. sd

## 2016-09-24 ENCOUNTER — Ambulatory Visit: Payer: Self-pay | Admitting: Physician Assistant

## 2016-10-30 ENCOUNTER — Encounter: Payer: Self-pay | Admitting: Physician Assistant

## 2016-10-30 ENCOUNTER — Ambulatory Visit (INDEPENDENT_AMBULATORY_CARE_PROVIDER_SITE_OTHER): Payer: BLUE CROSS/BLUE SHIELD | Admitting: Physician Assistant

## 2016-10-30 VITALS — BP 112/78 | HR 74 | Temp 98.1°F | Resp 16 | Ht <= 58 in | Wt 167.8 lb

## 2016-10-30 DIAGNOSIS — M6289 Other specified disorders of muscle: Secondary | ICD-10-CM

## 2016-10-30 DIAGNOSIS — Z1231 Encounter for screening mammogram for malignant neoplasm of breast: Secondary | ICD-10-CM | POA: Diagnosis not present

## 2016-10-30 DIAGNOSIS — E038 Other specified hypothyroidism: Secondary | ICD-10-CM

## 2016-10-30 DIAGNOSIS — Z136 Encounter for screening for cardiovascular disorders: Secondary | ICD-10-CM

## 2016-10-30 DIAGNOSIS — Z13220 Encounter for screening for lipoid disorders: Secondary | ICD-10-CM

## 2016-10-30 DIAGNOSIS — Z111 Encounter for screening for respiratory tuberculosis: Secondary | ICD-10-CM

## 2016-10-30 DIAGNOSIS — Z23 Encounter for immunization: Secondary | ICD-10-CM

## 2016-10-30 DIAGNOSIS — E559 Vitamin D deficiency, unspecified: Secondary | ICD-10-CM

## 2016-10-30 DIAGNOSIS — Z8249 Family history of ischemic heart disease and other diseases of the circulatory system: Secondary | ICD-10-CM | POA: Diagnosis not present

## 2016-10-30 DIAGNOSIS — Z833 Family history of diabetes mellitus: Secondary | ICD-10-CM

## 2016-10-30 DIAGNOSIS — E039 Hypothyroidism, unspecified: Secondary | ICD-10-CM | POA: Diagnosis not present

## 2016-10-30 DIAGNOSIS — Z0000 Encounter for general adult medical examination without abnormal findings: Secondary | ICD-10-CM

## 2016-10-30 DIAGNOSIS — Z1239 Encounter for other screening for malignant neoplasm of breast: Secondary | ICD-10-CM

## 2016-10-30 MED ORDER — METHOCARBAMOL 500 MG PO TABS: ORAL_TABLET | ORAL | 3 refills | Status: DC

## 2016-10-30 NOTE — Patient Instructions (Signed)
The Female Pelvic Floor  The pelvic floor consists of several layers of muscles that cover the bottom of the pelvic cavity. These muscles have several distinct roles:  1. To support the pelvic organs, the bladder, uterus and colon within the pelvis. 2. To assist in stopping and starting the flow of urine or the passage of gas or stool.To aid in sexual appreciation.  How to Locate the Pelvic Floor Muscles  The Urine Stop Test . At the midstream of your urine flow, squeeze the pelvic floor muscles. You should feel the sensation of the openings close and the muscles pulling up and into the pelvic cavity.  If you have strong muscles you will slow or stop the stream of urine. . Stop or slow the flow of urine without tensing the muscles of your legs, buttocks. . Do this only to locate the muscles, not as a daily exercise. Feeling the Muscle . Place a fingertip on the anal opening. Contract and lift the muscles as though you are holding back gas or stool. You will feel your anal opening tighten. . Insert 1 or 2 fingers into the vagina to feel the contraction and lifting of the muscles. You should feel the opening of the vagina tighten around your finger. Watching the Muscle Contract . Begin by lying on a flat surface.  Position yourself with your knees apart and bent with your head elevated and supported on several pillows. Use a mirror to look at the anal and vaginal openings and the perineal body (the area between the two openings).  . Contract or tighten the muscles around the openings and watch for a lifting of the perineal body and closure of the openings.   . If you see a bulge or feel tissues coming out of your openings, this is an incorrect contraction and you should notify your health care provider for more instructions.  2007, [employer] Doc.11  Your Home Program  General Guidelines for Pelvic Floor Exercise  Challenge your muscles to do more than they are used to doing.  The quality of the exercise is more important that the number you perform.  Avoid straining, holding your breath or using buttock or leg muscles while you exercise the pelvic floor muscles.  Count out loud and continue breathing to avoid straining.  Relax your body and breathe during your exercises.  Coordinate your breathing with your pelvic floor contraction by blowing out or exhaling while you contract your pelvic floor muscles.   Concentrate on activating both the sphincters and levator ani muscles of the pelvic floor with each exercise.  Position for the Exercises  Start lying down with your knees bent and supported with pillows.  Once you've gained awareness and can feel the contractions you may perform the exercises either sitting or standing.  For example, you can do them while driving, working on the computer, or waiting in lines.  Quick Contractions  Repeat this exercise 3 times.  Do the exercise 3 times per day.  Rapidly contract your pelvic floor muscles and hold for 2 seconds relax for 2 seconds.  Try to do the contraction on breathing exhalation.  Endurance Contractions  Repeat this 3 times.  Do the exercise 3 times per day.  Pull your pelvic floor muscles up and in and hold for 15 seconds then relax for 30 seconds.  Count out loud while you are holding the contraction to make sure that you are breathing throughout the exercise and not straining.  Other Exercises/Instructions  2007, [employer] Doc.37

## 2016-10-30 NOTE — Progress Notes (Signed)
Patient: Hannah [name], Female    DOB: [birth year], 51 y.o.   MRN: [id number] Visit Date: 10/30/2016  Today's Provider: Margaretann Loveless, PA-C   Chief Complaint  Patient presents with  . Annual Exam   Subjective:    Annual physical exam Hannah [name] is a 51 y.o. female who presents today for health maintenance and complete physical. She feels well. She reports exercising. She reports she is sleeping well. Patient also brings form to be fill out  Last CPE: ? Pap:(s/p hysterectomy)-[facility] -had hysterectomy in 2004 was seeing [facility] for H/A management. Last Colonoscopy: 07/30/07-for family Hx of colon cancer; half brother passed 32 yo.-Internal non-bleeding hemorrhoids-otherwise WNL Mammogram: Patient sees 100. -----------------------------------------------------------------  Rash: Patient complains of rash involving the left arm. Rash started 4 days ago. Appearance of rash at onset: Color of lesion(s): red. Rash has not changed over time. Discomfort associated with rash: is painful and and itching.  Associated symptoms: none. Denies: abdominal pain, congestion, cough, decrease in appetite, fever, irritability, myalgia, nausea, sore throat and vomiting. Patient has not had previous evaluation of rash. Patient has not had previous treatment.  Response to treatment: n/a. Patient has not had contacts with similar rash. Patient has had new exposures (soaps, lotions, laundry detergents, foods, medications, plants, insects or animals.)Patient was working in the yard on Saturday and the rash appeared Monday.   Review of Systems  Constitutional: Negative.  Negative for chills, fatigue and fever.  HENT: Negative.   Eyes: Negative.   Respiratory: Positive for shortness of breath.   Cardiovascular: Negative.  Negative for chest pain, palpitations and leg swelling.  Gastrointestinal: Negative.   Endocrine: Positive for cold intolerance  and heat intolerance.  Genitourinary: Negative.   Musculoskeletal: Positive for back pain.  Skin: Positive for rash.  Allergic/Immunologic: Positive for environmental allergies.  Neurological: Positive for headaches (this is chronic).  Hematological: Bruises/bleeds easily.  Psychiatric/Behavioral: Positive for sleep disturbance.    Social History      She  reports that she quit smoking about 25 years ago. Her smoking use included Cigarettes. She has a 9.00 pack-year smoking history. She has never used smokeless tobacco. She reports that she drinks alcohol. She reports that she does not use drugs.       Social History   Social History  . Marital status: Married    Spouse name: N/A  . Number of children: N/A  . Years of education: N/A   Social History Main Topics  . Smoking status: Former Smoker    Packs/day: 1.00    Years: 9.00    Types: Cigarettes    Quit date: 12/28/1990  . Smokeless tobacco: Never Used     Comment: quit 1992  . Alcohol use Yes     Comment: occasion "rare"  . Drug use: No  . Sexual activity: Not Currently    Partners: Male    Birth control/ protection: Surgical   Other Topics Concern  . None   Social History Narrative  . None    Past Medical History:  Diagnosis Date  . Allergy   . Anxiety   . Asthma   . Depression   . GERD (gastroesophageal reflux disease)   . Migraine      Patient Active Problem List   Diagnosis Date Noted  . Anxiety disorder 04/26/2015  . Abnormal cervical cytology 11/30/2014  . Colon polyp 11/30/2014  . Coitalgia 11/30/2014  . Endometriosis 11/30/2014  .  Family history of cardiovascular disease 11/30/2014  . Acid reflux 11/30/2014  . Cannot sleep 11/30/2014  . Headache, migraine 11/30/2014  . Loss of feeling or sensation 11/30/2014  . Subclinical hypothyroidism 11/30/2014  . Current tobacco use 11/30/2014  . Head revolving around 11/30/2014  . Avitaminosis D 11/30/2014  . HYPERSOMNIA 08/03/2007  . DYSPNEA ON  EXERTION 07/16/2007  . ABNORMAL PULMONARY TEST RESULTS 07/16/2007  . ASTHMA 06/22/2007    Past Surgical History:  Procedure Laterality Date  . ABDOMINAL HYSTERECTOMY  2004  . APPENDECTOMY  2004  . CERVICAL CONIZATION W/BX  11/1995  . CESAREAN SECTION      x2  . laposcophy  05/1996  . TONSILLECTOMY    . TUBAL LIGATION    . WISDOM TOOTH EXTRACTION     x 4    Family History        Family Status  Relation Status  . Mother Alive  . Father Deceased at age 49   heart disease  . Sister Alive  . Brother Deceased at age 49  . Paternal Grandmother Deceased        Her family history includes Arthritis in her mother; Cancer in her brother; Depression in her mother and sister; Diabetes in her father and paternal grandmother; Heart disease in her mother; Hypertension in her mother; Kidney disease in her mother. She was adopted.     Allergies  Allergen Reactions  . Cough-Cold Medicine  [Phenir-Pe-Sod Sal-Caff Cit] Itching  . Erythromycin   . Levofloxacin     headache, chest discomfort, insomnia  . Sulfonamide Derivatives   . Sulfur   . Tetracyclines & Related   . Tramadol     Lips and mouth numbness.     Current Outpatient Prescriptions:  .  cetirizine (ZYRTEC ALLERGY) 10 MG tablet, Take 10 mg by mouth daily., Disp: , Rfl:  .  ibuprofen (ADVIL,MOTRIN) 800 MG tablet, Take 1 tablet (800 mg total) by mouth every 8 (eight) hours as needed., Disp: 30 tablet, Rfl: 0 .  Mometasone Furo-Formoterol Fum (DULERA IN), Inhale into the lungs. Reported on 11/02/2015, Disp: , Rfl:  .  montelukast (SINGULAIR) 10 MG tablet, Take by mouth. Reported on 11/02/2015, Disp: , Rfl:  .  Omega-3 Fatty Acids (FISH OIL OMEGA-3 PO), Take by mouth., Disp: , Rfl:  .  omeprazole (PRILOSEC) 20 MG capsule, TAKE 1 CAPSULE (20 MG TOTAL) BY MOUTH DAILY., Disp: 30 capsule, Rfl: 5 .  PROAIR HFA 108 (90 BASE) MCG/ACT inhaler, Reported on 11/02/2015, Disp: , Rfl: 0 .  Vitamin D, Ergocalciferol, (DRISDOL) 50000 units CAPS  capsule, Take 1 capsule (50,000 Units total) by mouth every 7 (seven) days., Disp: 4 capsule, Rfl: 4 .  azithromycin (ZITHROMAX) 250 MG tablet, Take 250 mg by mouth once., Disp: , Rfl:  .  Beclomethasone Dipropionate 80 MCG/ACT AERS, Place into the nose. Reported on 11/02/2015, Disp: , Rfl:  .  busPIRone (BUSPAR) 10 MG tablet, TAKE 1 TABLET (10 MG TOTAL) BY MOUTH 2 (TWO) TIMES DAILY. (Patient not taking: Reported on 10/30/2016), Disp: 60 tablet, Rfl: 1 .  chlorpheniramine-HYDROcodone (TUSSIONEX PENNKINETIC ER) 10-8 MG/5ML SUER, Take 5 mLs by mouth every 12 (twelve) hours as needed for cough. (Patient not taking: Reported on 10/30/2016), Disp: 140 mL, Rfl: 0 .  LORazepam (ATIVAN) 1 MG tablet, Take 1 tablet (1 mg total) by mouth 2 (two) times daily as needed (muscle relaxation). (Patient not taking: Reported on 10/30/2016), Disp: 10 tablet, Rfl: 0 .  Melatonin 2.5 MG CAPS, Take  1 capsule (2.5 mg total) by mouth at bedtime. (Patient not taking: Reported on 10/30/2016), Disp: 30 each, Rfl: 0 .  meloxicam (MOBIC) 15 MG tablet, Take 1 tablet (15 mg total) by mouth daily. (Patient not taking: Reported on 10/30/2016), Disp: 30 tablet, Rfl: 2 .  predniSONE (STERAPRED UNI-PAK 21 TAB) 10 MG (21) TBPK tablet, Take as directed on package instructions. (Patient not taking: Reported on 10/30/2016), Disp: 21 tablet, Rfl: 0 .  topiramate (TOPAMAX) 200 MG tablet, TAKE 1 TABLET (200 MG TOTAL) BY MOUTH DAILY. (Patient not taking: Reported on 10/30/2016), Disp: 30 tablet, Rfl: 1   Patient Care Team: Margaretann Loveless, PA-C as PCP - General (Family Medicine)      Objective:   Vitals: BP 112/78 (BP Location: Left Arm, Patient Position: Sitting, Cuff Size: Large)   Pulse 74   Temp 98.1 F (36.7 C) (Oral)   Resp 16   Ht 4\' 10"  (1.473 m)   Wt 167 lb 12.8 oz (76.1 kg)   BMI 35.07 kg/m      Physical Exam  Constitutional: She is oriented to person, place, and time. She appears well-developed and well-nourished. No distress.    HENT:  Head: Normocephalic and atraumatic.  Right Ear: Hearing, tympanic membrane, external ear and ear canal normal.  Left Ear: Hearing, tympanic membrane, external ear and ear canal normal.  Nose: Nose normal.  Mouth/Throat: Uvula is midline, oropharynx is clear and moist and mucous membranes are normal. No oropharyngeal exudate.  Eyes: Conjunctivae and EOM are normal. Pupils are equal, round, and reactive to light. Right eye exhibits no discharge. Left eye exhibits no discharge. No scleral icterus.  Neck: Normal range of motion. Neck supple. No JVD present. No tracheal deviation present. No thyromegaly present.  Cardiovascular: Normal rate, regular rhythm, normal heart sounds and intact distal pulses.  Exam reveals no gallop and no friction rub.   No murmur heard. Pulmonary/Chest: Effort normal and breath sounds normal. No respiratory distress. She has no wheezes. She has no rales. She exhibits no tenderness.  Abdominal: Soft. Bowel sounds are normal. She exhibits no distension and no mass. There is no tenderness. There is no rebound and no guarding.  Musculoskeletal: Normal range of motion. She exhibits no edema or tenderness.  Lymphadenopathy:    She has no cervical adenopathy.  Neurological: She is alert and oriented to person, place, and time.  Skin: Skin is warm and dry. No rash noted. She is not diaphoretic.  Psychiatric: She has a normal mood and affect. Her behavior is normal. Judgment and thought content normal.  Vitals reviewed.    Depression Screen PHQ 2/9 Scores 10/30/2016  PHQ - 2 Score 0  PHQ- 9 Score 1      Assessment & Plan:     Routine Health Maintenance and Physical Exam  Exercise Activities and Dietary recommendations Goals    None      Immunization History  Administered Date(s) Administered  . Influenza Split 04/20/2012  . Tdap 11/29/1998    Health Maintenance  Topic Date Due  . HIV Screening  08/02/1982  . PAP SMEAR  08/02/1988  .  TETANUS/TDAP  11/28/2008  . INFLUENZA VACCINE  04/03/2017 (Originally 01/28/2017)     Discussed health benefits of physical activity, and encouraged her to engage in regular exercise appropriate for her age and condition.    1. Annual physical exam Normal physical exam today. Will check labs as below and f/u pending lab results. If labs are stable and WNL she  will not need to have these rechecked for one year at her next annual physical exam. She is to call the office in the meantime if she has any acute issue, questions or concerns. - CBC with Differential/Platelet - Comprehensive metabolic panel  2. Breast cancer screening Patient is followed by [facility] for breast screening and pelvic exams.   3. Subclinical hypothyroidism Still asymptomatic.   4. Family history of cardiovascular disease Will check labs as below and f/u pending results. - Lipid panel  5. Avitaminosis D H/O Vit D def. Will check labs as below and f/u pending results. - Vitamin D (25 hydroxy)  6. Pelvic floor dysfunction in female Patient has been having dyspareunia since she became sexually active as a young woman. She reports that insertion is what hurts the most. She also reports that having pap smears, even using the small speculum, is still incredibly painful and they always tell her to relax. She reports the only time she enjoys intercourse is when she is drinking. I feel she most likely has pelvic floor dysfunction with the pelvic floor muscles being too tight. Discussed relaxation techniques, biofeedback with pelvic floor therapy and using a muscle relaxer prior to intercourse. She would like to try the muscle relaxer initially and will consider pelvic floor therapy in the future. I did advise her to discuss this with her GYN as well.  - methocarbamol (ROBAXIN) 500 MG tablet; Take one-half to one tablet as directed in the office  Dispense: 30 tablet; Refill: 3  7. Encounter for lipid screening for  cardiovascular disease Will check labs as below and f/u pending results. - Lipid panel  8. Family history of diabetes mellitus (DM) Will check labs as below and f/u pending results. - Hemoglobin A1c  9. Need for Td vaccine Vaccine given to patient without complications. Patient sat for 15 minutes after administration and was tolerated well without adverse effects. - Td : Tetanus/diphtheria >7yo Preservative  free  10. Tuberculosis screening Needed for employment since she lived in [place] and [place] in the past.  - Quantiferon tb gold assay  --------------------------------------------------------------------    Margaretann Loveless, PA-C  [facility]

## 2016-11-01 LAB — COMPREHENSIVE METABOLIC PANEL
ALT: 38 IU/L — ABNORMAL HIGH (ref 0–32)
AST: 26 IU/L (ref 0–40)
Albumin/Globulin Ratio: 1.8 (ref 1.2–2.2)
Albumin: 4.6 g/dL (ref 3.5–5.5)
Alkaline Phosphatase: 92 IU/L (ref 39–117)
BILIRUBIN TOTAL: 0.5 mg/dL (ref 0.0–1.2)
BUN / CREAT RATIO: 21 (ref 9–23)
BUN: 16 mg/dL (ref 6–24)
CALCIUM: 9.7 mg/dL (ref 8.7–10.2)
CHLORIDE: 100 mmol/L (ref 96–106)
CO2: 24 mmol/L (ref 18–29)
Creatinine, Ser: 0.76 mg/dL (ref 0.57–1.00)
GFR, EST AFRICAN AMERICAN: 107 mL/min/{1.73_m2} (ref >59)
GFR, EST NON AFRICAN AMERICAN: 92 mL/min/{1.73_m2} (ref >59)
GLOBULIN, TOTAL: 2.6 g/dL (ref 1.5–4.5)
Glucose: 91 mg/dL (ref 65–99)
Potassium: 4.3 mmol/L (ref 3.5–5.2)
Sodium: 141 mmol/L (ref 134–144)
Total Protein: 7.2 g/dL (ref 6.0–8.5)

## 2016-11-01 LAB — CBC WITH DIFFERENTIAL/PLATELET
BASOS ABS: 0.1 10*3/uL (ref 0.0–0.2)
Basos: 1 %
EOS (ABSOLUTE): 0.1 10*3/uL (ref 0.0–0.4)
Eos: 1 %
HEMATOCRIT: 40.7 % (ref 34.0–46.6)
Hemoglobin: 13.7 g/dL (ref 11.1–15.9)
IMMATURE GRANULOCYTES: 0 %
Immature Grans (Abs): 0 10*3/uL (ref 0.0–0.1)
LYMPHS ABS: 2.5 10*3/uL (ref 0.7–3.1)
Lymphs: 27 %
MCH: 28.1 pg (ref 26.6–33.0)
MCHC: 33.7 g/dL (ref 31.5–35.7)
MCV: 83 fL (ref 79–97)
MONOS ABS: 0.5 10*3/uL (ref 0.1–0.9)
Monocytes: 5 %
NEUTROS PCT: 66 %
Neutrophils Absolute: 5.9 10*3/uL (ref 1.4–7.0)
PLATELETS: 270 10*3/uL (ref 150–379)
RBC: 4.88 x10E6/uL (ref 3.77–5.28)
RDW: 13.2 % (ref 12.3–15.4)
WBC: 9 10*3/uL (ref 3.4–10.8)

## 2016-11-01 LAB — LIPID PANEL
CHOL/HDL RATIO: 3.7 ratio (ref 0.0–4.4)
Cholesterol, Total: 247 mg/dL — ABNORMAL HIGH (ref 100–199)
HDL: 66 mg/dL (ref >39)
LDL CALC: 157 mg/dL — AB (ref 0–99)
TRIGLYCERIDES: 122 mg/dL (ref 0–149)
VLDL Cholesterol Cal: 24 mg/dL (ref 5–40)

## 2016-11-01 LAB — VITAMIN D 25 HYDROXY (VIT D DEFICIENCY, FRACTURES): VIT D 25 HYDROXY: 26.5 ng/mL — AB (ref 30.0–100.0)

## 2016-11-01 LAB — HEMOGLOBIN A1C
Est. average glucose Bld gHb Est-mCnc: 97 mg/dL
HEMOGLOBIN A1C: 5 % (ref 4.8–5.6)

## 2016-11-03 LAB — QUANTIFERON IN TUBE
QFT TB AG MINUS NIL VALUE: 0.01 [IU]/mL
QUANTIFERON MITOGEN VALUE: 5.93 IU/mL
QUANTIFERON TB AG VALUE: 0.04 IU/mL
QUANTIFERON TB GOLD: NEGATIVE
Quantiferon Nil Value: 0.03 IU/mL

## 2016-11-03 LAB — QUANTIFERON TB GOLD ASSAY (BLOOD)

## 2016-11-04 ENCOUNTER — Telehealth: Payer: Self-pay

## 2016-11-04 NOTE — Telephone Encounter (Signed)
-----   Message from Margaretann LovelessJennifer M Burnette, PA-C sent at 11/04/2016  8:18 AM EDT ----- Sorry for the delay. Quantiferon gold came back negative (TB test). All labs are fairly stable and WNL with exception of cholesterol. Total cholesterol is up at 247 and LDL is up to 157 from 117 2 yrs ago. HDL (good) cholesterol is elevated at 66 which offers cardioprotection. Also your ASCVD 10 yr risk is very low at 0.93%. We normally want to add a cholesterol lowering medication if this gets over 7.5%. Work on lifestyle modifications with heart healthy dieting and physical activity. Your form for work has been completed as well.

## 2016-11-04 NOTE — Telephone Encounter (Signed)
[id number] 

## 2016-11-04 NOTE — Telephone Encounter (Signed)
Patient was advised and copy of assessment was filled and left up front by P.A

## 2016-11-29 ENCOUNTER — Other Ambulatory Visit: Payer: Self-pay | Admitting: Physician Assistant

## 2016-11-29 DIAGNOSIS — E559 Vitamin D deficiency, unspecified: Secondary | ICD-10-CM

## 2017-02-02 ENCOUNTER — Ambulatory Visit (INDEPENDENT_AMBULATORY_CARE_PROVIDER_SITE_OTHER): Payer: BLUE CROSS/BLUE SHIELD | Admitting: Physician Assistant

## 2017-02-02 ENCOUNTER — Encounter: Payer: Self-pay | Admitting: Physician Assistant

## 2017-02-02 VITALS — BP 120/70 | HR 79 | Temp 98.3°F | Resp 16 | Wt 168.8 lb

## 2017-02-02 DIAGNOSIS — R0981 Nasal congestion: Secondary | ICD-10-CM | POA: Diagnosis not present

## 2017-02-02 DIAGNOSIS — R5383 Other fatigue: Secondary | ICD-10-CM | POA: Diagnosis not present

## 2017-02-02 DIAGNOSIS — W57XXXA Bitten or stung by nonvenomous insect and other nonvenomous arthropods, initial encounter: Secondary | ICD-10-CM | POA: Diagnosis not present

## 2017-02-02 DIAGNOSIS — R197 Diarrhea, unspecified: Secondary | ICD-10-CM | POA: Diagnosis not present

## 2017-02-02 MED ORDER — ONDANSETRON HCL 4 MG PO TABS: 4.0000 mg | ORAL_TABLET | Freq: Three times a day (TID) | ORAL | 0 refills | Status: DC | PRN

## 2017-02-02 MED ORDER — DOXYCYCLINE HYCLATE 100 MG PO TABS: 100.0000 mg | ORAL_TABLET | Freq: Two times a day (BID) | ORAL | 0 refills | Status: DC

## 2017-02-02 NOTE — Progress Notes (Signed)
Patient: Hannah [name] Female    DOB: 11/29/1965   51 y.o.   MRN: [id number] Visit Date: 02/02/2017  Today's Provider: Margaretann LovelessJennifer M Arlyss Weathersby, PA-C   Chief Complaint  Patient presents with  . Rash   Subjective:    Rash  This is a new problem. The current episode started more than 1 month ago (June 24th). The affected locations include the back. The rash is characterized by itchiness and redness. She was exposed to an insect bite/sting (Tick bite). Associated symptoms include congestion (Sinus congestion), diarrhea, fatigue and a sore throat (off and on). Pertinent negatives include no shortness of breath or vomiting. Treatments tried: oils-lavender and peppermint. The treatment provided no relief.      Allergies  Allergen Reactions  . Cough-Cold Medicine  [Phenir-Pe-Sod Sal-Caff Cit] Itching  . Erythromycin   . Levofloxacin     headache, chest discomfort, insomnia  . Sulfonamide Derivatives   . Sulfur   . Tetracyclines & Related   . Tramadol     Lips and mouth numbness.     Current Outpatient Prescriptions:  .  Beclomethasone Dipropionate 80 MCG/ACT AERS, Place into the nose. Reported on 11/02/2015, Disp: , Rfl:  .  cetirizine (ZYRTEC ALLERGY) 10 MG tablet, Take 10 mg by mouth daily., Disp: , Rfl:  .  ibuprofen (ADVIL,MOTRIN) 800 MG tablet, Take 1 tablet (800 mg total) by mouth every 8 (eight) hours as needed., Disp: 30 tablet, Rfl: 0 .  Mometasone Furo-Formoterol Fum (DULERA IN), Inhale into the lungs. Reported on 11/02/2015, Disp: , Rfl:  .  montelukast (SINGULAIR) 10 MG tablet, Take by mouth. Reported on 11/02/2015, Disp: , Rfl:  .  Omega-3 Fatty Acids (FISH OIL OMEGA-3 PO), Take by mouth., Disp: , Rfl:  .  omeprazole (PRILOSEC) 20 MG capsule, TAKE 1 CAPSULE (20 MG TOTAL) BY MOUTH DAILY., Disp: 30 capsule, Rfl: 5 .  PROAIR HFA 108 (90 BASE) MCG/ACT inhaler, Reported on 11/02/2015, Disp: , Rfl: 0 .  Vitamin D, Ergocalciferol, (DRISDOL) 50000 units CAPS capsule, TAKE 1 CAPSULE  (50,000 UNITS TOTAL) BY MOUTH EVERY 7 (SEVEN) DAYS., Disp: 4 capsule, Rfl: 4 .  azithromycin (ZITHROMAX) 250 MG tablet, Take 250 mg by mouth once., Disp: , Rfl:  .  busPIRone (BUSPAR) 10 MG tablet, TAKE 1 TABLET (10 MG TOTAL) BY MOUTH 2 (TWO) TIMES DAILY. (Patient not taking: Reported on 10/30/2016), Disp: 60 tablet, Rfl: 1 .  chlorpheniramine-HYDROcodone (TUSSIONEX PENNKINETIC ER) 10-8 MG/5ML SUER, Take 5 mLs by mouth every 12 (twelve) hours as needed for cough. (Patient not taking: Reported on 10/30/2016), Disp: 140 mL, Rfl: 0 .  LORazepam (ATIVAN) 1 MG tablet, Take 1 tablet (1 mg total) by mouth 2 (two) times daily as needed (muscle relaxation). (Patient not taking: Reported on 10/30/2016), Disp: 10 tablet, Rfl: 0 .  Melatonin 2.5 MG CAPS, Take 1 capsule (2.5 mg total) by mouth at bedtime. (Patient not taking: Reported on 10/30/2016), Disp: 30 each, Rfl: 0 .  meloxicam (MOBIC) 15 MG tablet, Take 1 tablet (15 mg total) by mouth daily. (Patient not taking: Reported on 10/30/2016), Disp: 30 tablet, Rfl: 2 .  methocarbamol (ROBAXIN) 500 MG tablet, Take one-half to one tablet as directed in the office (Patient not taking: Reported on 02/02/2017), Disp: 30 tablet, Rfl: 3 .  predniSONE (STERAPRED UNI-PAK 21 TAB) 10 MG (21) TBPK tablet, Take as directed on package instructions. (Patient not taking: Reported on 10/30/2016), Disp: 21 tablet, Rfl: 0 .  topiramate (TOPAMAX) 200  MG tablet, TAKE 1 TABLET (200 MG TOTAL) BY MOUTH DAILY. (Patient not taking: Reported on 10/30/2016), Disp: 30 tablet, Rfl: 1  Review of Systems  Constitutional: Positive for fatigue.  HENT: Positive for congestion (Sinus congestion), sinus pain, sinus pressure and sore throat (off and on).   Respiratory: Negative for chest tightness, shortness of breath and wheezing.   Cardiovascular: Negative for chest pain, palpitations and leg swelling.  Gastrointestinal: Positive for diarrhea and nausea. Negative for vomiting.  Endocrine: Negative.     Genitourinary: Negative.   Skin: Positive for rash.  Neurological: Negative for dizziness, weakness, light-headedness, numbness and headaches.    Social History  Substance Use Topics  . Smoking status: Former Smoker    Packs/day: 1.00    Years: 9.00    Types: Cigarettes    Quit date: 12/28/1990  . Smokeless tobacco: Never Used     Comment: quit 1992  . Alcohol use Yes     Comment: occasion "rare"   Objective:   BP 120/70 (BP Location: Right Arm, Patient Position: Sitting, Cuff Size: Normal)   Pulse 79   Temp 98.3 F (36.8 C) (Oral)   Resp 16   Wt 168 lb 12.8 oz (76.6 kg)   BMI 35.28 kg/m    Physical Exam  Constitutional: She appears well-developed and well-nourished. No distress.  HENT:  Head: Normocephalic and atraumatic.  Right Ear: Hearing, tympanic membrane, external ear and ear canal normal.  Left Ear: Hearing, tympanic membrane, external ear and ear canal normal.  Nose: Nose normal.  Mouth/Throat: Uvula is midline, oropharynx is clear and moist and mucous membranes are normal. No oropharyngeal exudate.  Eyes: Pupils are equal, round, and reactive to light. Conjunctivae are normal. Right eye exhibits no discharge. Left eye exhibits no discharge. No scleral icterus.  Neck: Normal range of motion. Neck supple. No tracheal deviation present. No thyromegaly present.  Cardiovascular: Normal rate, regular rhythm and normal heart sounds.  Exam reveals no gallop and no friction rub.   No murmur heard. Pulmonary/Chest: Effort normal and breath sounds normal. No stridor. No respiratory distress. She has no wheezes. She has no rales.  Musculoskeletal: She exhibits no edema.  Lymphadenopathy:    She has no cervical adenopathy.  Skin: Skin is warm and dry. She is not diaphoretic.     Vitals reviewed.       Assessment & Plan:     1. Tick bite, initial encounter Possible STARI reaction to lonestar tick bite. DDx: secondary skin infection from scratching, RMSF (no fever  or all over rash), Lyme (unlikely as this was an identified Lonestar tick). I will treat as if it is STARI with doxycycline as below. Zofran given as prophylaxis as patient has had severe nausea with tetracycline when she was a teenager. She is to call if rash worsens or symptoms do not improve.  - doxycycline (VIBRA-TABS) 100 MG tablet; Take 1 tablet (100 mg total) by mouth 2 (two) times daily.  Dispense: 14 tablet; Refill: 0 - ondansetron (ZOFRAN) 4 MG tablet; Take 1 tablet (4 mg total) by mouth every 8 (eight) hours as needed for nausea or vomiting.  Dispense: 20 tablet; Refill: 0       Margaretann Loveless, PA-C  [facility]

## 2017-02-03 NOTE — Patient Instructions (Signed)

## 2017-02-17 ENCOUNTER — Telehealth: Payer: Self-pay

## 2017-02-17 DIAGNOSIS — R21 Rash and other nonspecific skin eruption: Secondary | ICD-10-CM

## 2017-02-17 MED ORDER — PREDNISONE 10 MG (21) PO TBPK: ORAL_TABLET | ORAL | 0 refills | Status: DC

## 2017-02-17 NOTE — Telephone Encounter (Signed)
Can send in prednisone to see if this may help in meantime. Discontinue if rash spreads.

## 2017-02-17 NOTE — Telephone Encounter (Signed)
Patient called stating that she was seen for a tick bite on 02/02/2017. She was treated with 7 day course of doxycyline. She states she went to an Urgent Care in [place] to have the rash rechecked and was given an additional 3 days of Doxycyline. She has completed all doses of the Doxycycline and the rash is getting larger. She states the rash is still very itchy and at night she has muscle aches. She has been using hydrocortisone cream along with cold compresses. Patient is scheduled to come in to have rash rechecked on Thursday 02/19/2017 at 11am. This was the soonest time that she could come in due to her going out of town tomorrow and no openings this afternoon.

## 2017-02-17 NOTE — Telephone Encounter (Signed)
Patient advised.

## 2017-02-19 ENCOUNTER — Ambulatory Visit: Payer: Self-pay | Admitting: Physician Assistant

## 2017-02-26 ENCOUNTER — Telehealth: Payer: Self-pay | Admitting: Physician Assistant

## 2017-02-26 ENCOUNTER — Encounter: Payer: Self-pay | Admitting: Physician Assistant

## 2017-02-26 ENCOUNTER — Ambulatory Visit (INDEPENDENT_AMBULATORY_CARE_PROVIDER_SITE_OTHER): Payer: BLUE CROSS/BLUE SHIELD | Admitting: Physician Assistant

## 2017-02-26 VITALS — BP 110/80 | HR 70 | Temp 98.1°F | Resp 16 | Wt 164.2 lb

## 2017-02-26 DIAGNOSIS — W57XXXS Bitten or stung by nonvenomous insect and other nonvenomous arthropods, sequela: Secondary | ICD-10-CM

## 2017-02-26 DIAGNOSIS — R21 Rash and other nonspecific skin eruption: Secondary | ICD-10-CM

## 2017-02-26 MED ORDER — PREDNISONE 10 MG PO TABS: ORAL_TABLET | ORAL | 0 refills | Status: DC

## 2017-02-26 NOTE — Telephone Encounter (Signed)
error 

## 2017-02-26 NOTE — Progress Notes (Signed)
Patient: Hannah [name] Female    DOB: May 28, 1966   51 y.o.   MRN: [id number] Visit Date: 02/26/2017  Today's Provider: Margaretann Loveless, PA-C   Chief Complaint  Patient presents with  . Rash   Subjective:    HPI Patient here C/O recurrent rash on right side of abdomen area. Patient reports rash reappeared yesterday, reports very itchy and feels"bumpy." Patient reports using OTC hydrocortisone reports not helping. Patient was seen on 02/02/17 and urgent care on 02/09/17 in [place] for additional 3 days of antibiotic.   Patient C/O stomach ache after eating meats, patient reports that fish does not bother her. Patient reports this has been happing since getting the tick bite.    Allergies  Allergen Reactions  . Cough-Cold Medicine  [Phenir-Pe-Sod Sal-Caff Cit] Itching  . Erythromycin   . Levofloxacin     headache, chest discomfort, insomnia  . Sulfonamide Derivatives   . Sulfur   . Tetracyclines & Related   . Tramadol     Lips and mouth numbness.     Current Outpatient Prescriptions:  .  cetirizine (ZYRTEC ALLERGY) 10 MG tablet, Take 10 mg by mouth daily., Disp: , Rfl:  .  ibuprofen (ADVIL,MOTRIN) 800 MG tablet, Take 1 tablet (800 mg total) by mouth every 8 (eight) hours as needed., Disp: 30 tablet, Rfl: 0 .  Mometasone Furo-Formoterol Fum (DULERA IN), Inhale into the lungs. Reported on 11/02/2015, Disp: , Rfl:  .  montelukast (SINGULAIR) 10 MG tablet, Take by mouth. Reported on 11/02/2015, Disp: , Rfl:  .  Omega-3 Fatty Acids (FISH OIL OMEGA-3 PO), Take by mouth., Disp: , Rfl:  .  omeprazole (PRILOSEC) 20 MG capsule, TAKE 1 CAPSULE (20 MG TOTAL) BY MOUTH DAILY., Disp: 30 capsule, Rfl: 5 .  ondansetron (ZOFRAN) 4 MG tablet, Take 1 tablet (4 mg total) by mouth every 8 (eight) hours as needed for nausea or vomiting., Disp: 20 tablet, Rfl: 0 .  PROAIR HFA 108 (90 BASE) MCG/ACT inhaler, Reported on 11/02/2015, Disp: , Rfl: 0 .  Vitamin D, Ergocalciferol, (DRISDOL) 50000  units CAPS capsule, TAKE 1 CAPSULE (50,000 UNITS TOTAL) BY MOUTH EVERY 7 (SEVEN) DAYS., Disp: 4 capsule, Rfl: 4  Review of Systems  Constitutional: Negative.   Cardiovascular: Negative.   Gastrointestinal: Positive for abdominal pain.  Skin: Positive for rash.    Social History  Substance Use Topics  . Smoking status: Former Smoker    Packs/day: 1.00    Years: 9.00    Types: Cigarettes    Quit date: 12/28/1990  . Smokeless tobacco: Never Used     Comment: quit 1992  . Alcohol use Yes     Comment: occasion "rare"   Objective:   BP 110/80 (BP Location: Left Arm, Patient Position: Sitting, Cuff Size: Large)   Pulse 70   Temp 98.1 F (36.7 C) (Oral)   Resp 16   Wt 164 lb 3.2 oz (74.5 kg)   SpO2 98%   BMI 34.32 kg/m  Vitals:   02/26/17 1020  BP: 110/80  Pulse: 70  Resp: 16  Temp: 98.1 F (36.7 C)  TempSrc: Oral  SpO2: 98%  Weight: 164 lb 3.2 oz (74.5 kg)     Physical Exam  Constitutional: She appears well-developed and well-nourished. No distress.  Neck: Normal range of motion. Neck supple.  Cardiovascular: Normal rate, regular rhythm and normal heart sounds.  Exam reveals no gallop and no friction rub.   No murmur heard. Pulmonary/Chest: Effort  normal and breath sounds normal. No respiratory distress. She has no wheezes. She has no rales.  Skin: Rash noted. Rash is papular. She is not diaphoretic.     Vitals reviewed.       Assessment & Plan:     1. Rash Will do second round of prednisone as below to see if this helps resolve symptoms. Offered testing for lyme or RMSF which she declines. I feel this is more an allergic response at this time than actual tick borne disease. She is to call if symptoms worsen.  - predniSONE (DELTASONE) 10 MG tablet; Take 6 tabs PO on day 1&2, 5 tabs PO on day 3&4, 4 tabs PO on day 5&6, 3 tabs PO on day 7&8, 2 tabs PO on day 9&10, 1 tab PO on day 11&12.  Dispense: 42 tablet; Refill: 0  2. Tick bite, sequela Patient does mention  that she has had intolerance to red meats last 3 times she has eaten them, causing bloating and severe nausea. Discussed testing for alpha gal IgE but she states she is just going to avoid red meats for the time being.  - predniSONE (DELTASONE) 10 MG tablet; Take 6 tabs PO on day 1&2, 5 tabs PO on day 3&4, 4 tabs PO on day 5&6, 3 tabs PO on day 7&8, 2 tabs PO on day 9&10, 1 tab PO on day 11&12.  Dispense: 42 tablet; Refill: 0       Margaretann LovelessJennifer M Burnette, PA-C  [facility]

## 2017-04-21 ENCOUNTER — Encounter: Payer: Self-pay | Admitting: Physician Assistant

## 2017-04-21 ENCOUNTER — Ambulatory Visit (INDEPENDENT_AMBULATORY_CARE_PROVIDER_SITE_OTHER): Payer: 59 | Admitting: Physician Assistant

## 2017-04-21 VITALS — BP 110/60 | HR 67 | Temp 98.3°F | Resp 16 | Wt 170.4 lb

## 2017-04-21 DIAGNOSIS — R21 Rash and other nonspecific skin eruption: Secondary | ICD-10-CM

## 2017-04-21 DIAGNOSIS — Z23 Encounter for immunization: Secondary | ICD-10-CM

## 2017-04-21 DIAGNOSIS — Z91018 Allergy to other foods: Secondary | ICD-10-CM | POA: Diagnosis not present

## 2017-04-21 DIAGNOSIS — W57XXXS Bitten or stung by nonvenomous insect and other nonvenomous arthropods, sequela: Secondary | ICD-10-CM | POA: Diagnosis not present

## 2017-04-21 MED ORDER — PREDNISONE 10 MG PO TABS: ORAL_TABLET | ORAL | 1 refills | Status: DC

## 2017-04-21 NOTE — Progress Notes (Signed)
Patient: Hannah [name] Female    DOB: 10/05/1965   51 y.o.   MRN: [id number] Visit Date: 04/21/2017  Today's Provider: Margaretann LovelessJennifer M Jacquelene Kopecky, PA-C   Chief Complaint  Patient presents with  . Rash   Subjective:    Rash  This is a recurrent (she has this rash 2 months ago) problem. The current episode started 1 to 4 weeks ago. The problem has been gradually worsening since onset. The affected locations include the back (Lower back). The rash is characterized by itchiness. She was exposed to nothing. Associated symptoms include fatigue. Pertinent negatives include no shortness of breath. Past treatments include anti-itch cream. The treatment provided no relief.   Patient reports that she started back on the red meat two weeks ago which she has not eaten since the tick bite. She doesn't know if is related since the rash started last week.   This has been an ongoing issue since 01/2017 when patient was exposed to a [place] tick bite. The month following the tick bite it took multiple rounds of prednisone to calm the rash down. She also noted having upset stomach and itching with eating red meat thus she stopped eating red meats until two weeks ago.     Allergies  Allergen Reactions  . Cough-Cold Medicine  [Phenir-Pe-Sod Sal-Caff Cit] Itching  . Erythromycin   . Levofloxacin     headache, chest discomfort, insomnia  . Sulfonamide Derivatives   . Sulfur   . Tetracyclines & Related   . Tramadol     Lips and mouth numbness.     Current Outpatient Prescriptions:  .  cetirizine (ZYRTEC ALLERGY) 10 MG tablet, Take 10 mg by mouth daily., Disp: , Rfl:  .  ibuprofen (ADVIL,MOTRIN) 800 MG tablet, Take 1 tablet (800 mg total) by mouth every 8 (eight) hours as needed., Disp: 30 tablet, Rfl: 0 .  Mometasone Furo-Formoterol Fum (DULERA IN), Inhale into the lungs. Reported on 11/02/2015, Disp: , Rfl:  .  montelukast (SINGULAIR) 10 MG tablet, Take by mouth. Reported on 11/02/2015, Disp: , Rfl:    .  Omega-3 Fatty Acids (FISH OIL OMEGA-3 PO), Take by mouth., Disp: , Rfl:  .  omeprazole (PRILOSEC) 20 MG capsule, TAKE 1 CAPSULE (20 MG TOTAL) BY MOUTH DAILY., Disp: 30 capsule, Rfl: 5 .  PROAIR HFA 108 (90 BASE) MCG/ACT inhaler, Reported on 11/02/2015, Disp: , Rfl: 0 .  Vitamin D, Ergocalciferol, (DRISDOL) 50000 units CAPS capsule, TAKE 1 CAPSULE (50,000 UNITS TOTAL) BY MOUTH EVERY 7 (SEVEN) DAYS., Disp: 4 capsule, Rfl: 4 .  ondansetron (ZOFRAN) 4 MG tablet, Take 1 tablet (4 mg total) by mouth every 8 (eight) hours as needed for nausea or vomiting. (Patient not taking: Reported on 04/21/2017), Disp: 20 tablet, Rfl: 0 .  predniSONE (DELTASONE) 10 MG tablet, Take 6 tabs PO on day 1&2, 5 tabs PO on day 3&4, 4 tabs PO on day 5&6, 3 tabs PO on day 7&8, 2 tabs PO on day 9&10, 1 tab PO on day 11&12. (Patient not taking: Reported on 04/21/2017), Disp: 42 tablet, Rfl: 0  Review of Systems  Constitutional: Positive for fatigue.  Respiratory: Negative for shortness of breath.   Cardiovascular: Negative for chest pain, palpitations and leg swelling.  Musculoskeletal: Positive for joint swelling.  Skin: Positive for rash.  Neurological: Positive for headaches.    Social History  Substance Use Topics  . Smoking status: Former Smoker    Packs/day: 1.00    Years:  9.00    Types: Cigarettes    Quit date: 12/28/1990  . Smokeless tobacco: Never Used     Comment: quit 1992  . Alcohol use Yes     Comment: occasion "rare"   Objective:   BP 110/60 (BP Location: Right Arm, Patient Position: Sitting, Cuff Size: Normal)   Pulse 67   Temp 98.3 F (36.8 C) (Oral)   Resp 16   Wt 170 lb 6.4 oz (77.3 kg)   BMI 35.61 kg/m    Physical Exam  Constitutional: She appears well-developed and well-nourished. No distress.  Neck: Normal range of motion. Neck supple. No JVD present. No tracheal deviation present. No thyromegaly present.  Cardiovascular: Normal rate, regular rhythm and normal heart sounds.  Exam  reveals no gallop and no friction rub.   No murmur heard. Pulmonary/Chest: Effort normal and breath sounds normal. No respiratory distress. She has no wheezes. She has no rales.  Lymphadenopathy:    She has no cervical adenopathy.  Skin: Rash noted. Rash is macular. She is not diaphoretic.     Vitals reviewed.      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 Patient had no symptoms when off red meat, but since starting red meat back she has developed swelling and aching of her hands and fingers and now has the same rash that was present initially. Will treat with prednisone taper as below. She is to see her allergist in the coming months and I advised her to inform them of symptoms as well. Also continue to avoid red meat at this time. She is to call the office if symptoms worsen or do not improve.   2. Rash See above medical treatment plan. - predniSONE (DELTASONE) 10 MG tablet; Take 6 tabs PO on day 1&2, 5 tabs PO on day 3&4, 4 tabs PO on day 5&6, 3 tabs PO on day 7&8, 2 tabs PO on day 9&10, 1 tab PO on day 11&12.  Dispense: 42 tablet; Refill: 1  3. Tick bite, sequela See above medical treatment plan. - predniSONE (DELTASONE) 10 MG tablet; Take 6 tabs PO on day 1&2, 5 tabs PO on day 3&4, 4 tabs PO on day 5&6, 3 tabs PO on day 7&8, 2 tabs PO on day 9&10, 1 tab PO on day 11&12.  Dispense: 42 tablet; Refill: 1  4. Need for influenza vaccination Flu vaccine given today without complication. Patient sat upright for 15 minutes to check for adverse reaction before being released. - Flu Vaccine QUAD 36+ mos IM       Margaretann Loveless, PA-C  [facility]

## 2017-04-21 NOTE — Patient Instructions (Signed)
Alpha-Gal Allergy Overview: If your nose gets stuffy or begins to run after eating meat, or you become nauseated or develop a rash, you may have a meat allergy. Meat from any kind of mammal - beef, lamb, pork, goat, and even whale and seal - can cause an allergic reaction. While we do not definitively know the number of people in the [place] affected by meat allergy, we do know that it is uncommon. A bite from the [facility] tick can cause people to develop an allergy to red meat, including beef and pork. The [place] tick has been implicated in initiating the red meat allergy in the [place] and this tick is found predominantly in the [place] from [place], to [place], into [place].  A meat allergy can develop any time in life. If you are allergic to one type of meat, it is possible you also are allergic to other meats, as well as to poultry, such as chicken, [place] and duck. Studies have found that a very small percentage of children with milk allergy are also allergic to beef. Talk with your allergist to see if you should remove beef from your milk-allergic child's diet. Symptoms Hives or skin rash  Nausea, stomach cramps, indigestion, vomiting, diarrhea  Stuffy/runny nose  Sneezing  Headaches  Asthma  Anaphylaxis, a severe potentially deadly allergic reaction that restricts breathing Triggers Eating meat from mammals and sometimes poultry Management and Treatment Avoid foods that trigger symptoms  Control some symptoms with antihistamines and corticosteroids  Reverse severe reactions, particularly anaphylaxis with prescription injected epinephrine Symptoms Meat allergies develop when the body's immune system becomes sensitized and overreacts to something in the environment that typically causes no problem in most people.  Hives or skin rash  Nausea, stomach cramps, indigestion, vomiting, diarrhea  Stuffy/runny nose  Sneezing  Headaches  Asthma  Anaphylaxis, a severe potentially deadly allergic  reaction that restricts breathing If you are allergic to meat, your body considers meat to be a physical threat. The first time you have this response, your immune system makes specific immunoglobulin E (IgE) antibodies to fight the threat off. These antibodies attached to immune cells throughout your body.  After that, each time you eat meat, the allergen binds to the IgE antibodies and causes the cells to release massive amounts of histamine and other chemicals to try to protect you.  Depending on the tissue in which these antibodies are released, these chemicals will cause you to have symptoms that can range from mild to severe. A severe allergic reaction can include anaphylaxis, a potentially life-threatening reaction that must be treated immediately. A bite from the [facility] tick can cause people to develop an allergy to red meat, including beef and pork. This specific allergy is related to a carbohydrate called alpha-gal and is best diagnosed with a blood test. Although reactions to foods typically occur immediately, in the instance of allergic reactions to alpha-gal, symptoms often take several hours to develop. Owing to the significant delay between eating red meat and the appearance of an allergic reaction, it can be a challenge to connect the culprit foods to symptoms. Therefore, an expert evaluation from an allergist familiar with the condition is recommended. The [place] tick has been implicated in initiating the red meat allergy in the [place] and this tick is found predominantly in the [place] from [place], to [place], into [place].  Diagnosis Symptoms of meat allergy can vary from person to person, and you may not always experience the  same symptoms during every reaction. Allergic reactions to food can affect the skin, respiratory tract, gastrointestinal tract, and cardiovascular system. Meat allergies may also develop at various ages.  If you suspect that you have a meat allergy, see an  allergist, who will decide which tests to perform, determine if a food allergy exists, and work with you on managing your allergy.  To make a diagnosis, allergists ask detailed questions about your medical history and your symptoms. Be prepared to answer questions about: What and how much you ate  How long it took for symptoms to develop  What symptoms you experienced and how long they lasted. The allergist will usually order a blood test and/or perform a skin test. These indicate whether food-specific immunoglobulin E (IgE) antibodies are present in your body.  Skin tests provide results in about 20 minutes. The skin on your arm or back is pricked with a sterile small probe that contains a tiny amount of the food allergen. The tests, which are not painful but can be uncomfortable, are considered positive if a wheal (resembling the bump from a mosquito bite) develops at the site.  Blood tests measure the amount of IgE antibody to the specific food(s) being tested. Results are typically available in about one to two weeks and are reported as a numerical value.  Your allergist will use the results of these tests in making a diagnosis. While both of these diagnostic tools can signal a food allergy, an allergist may need to consider your medical history and conduct additional tests before confirming your diagnosis.  In some cases, an allergist may wish to conduct a double-blind, placebo-controlled oral food challenge, which is considered to be the gold standard for food allergy diagnosis. It can be costly and time-consuming. In some cases it is potentially dangerous, so it is not routinely performed.  During an oral food challenge, the patient is fed tiny amounts of the suspected trigger food in increasing doses over a period of time under strict supervision by an allergist. Emergency medication and emergency equipment are on hand during this procedure. Oral food challenges also may be performed to determine  if a patient has outgrown a food allergy. Management and Treatment Once a meat allergy is diagnosed, the best treatment is to avoid the trigger. Carefully check ingredient labels of food products, and learn whether what you need to avoid is known by other names.  Be extra careful when you eat out. Waiters (and sometimes the kitchen staff) may not always know the ingredients of every dish on the menu. Depending on your sensitivity, even just walking into a kitchen or another place where food is prepared can cause an allergic reaction.  All patients with food allergies must make some changes in what they eat. Your allergist can direct you to helpful resources, including special cookbooks, patient support groups, and registered dietitians, who can help you plan meals. Managing a severe food reaction with epinephrine A food allergy, including a meat allergy, can cause symptoms that range from mild to life-threatening; the severity of each reaction is unpredictable. People who have previously experienced only mild symptoms may suddenly experience a life-threatening reaction called anaphylaxis. In the [place] food allergy is the leading cause of anaphylaxis outside the hospital setting.  Epinephrine is the first-line treatment for anaphylaxis, which results when exposure to an allergen triggers a flood of chemicals that can send your body into shock. Anaphylaxis can occur within seconds or minutes, can worsen quickly, and can be deadly.  Once you've been diagnosed with a food allergy, your allergist will likely prescribe an epinephrine auto-injector and teach you how to use it. Check the expiration date of your auto-injector, note the expiration date on your calendar, and ask your pharmacy about reminder services for prescription renewals.  Be sure to have two doses available, as the severe reaction may reoccur. If you've had a history of severe reactions, take epinephrine as soon as you suspect you've eaten an  allergy-causing food or if you feel a reaction coming on. Epinephrine should be used immediately if you experience severe symptoms such as shortness of breath, repetitive coughing, weak pulse, hives, tightness in your throat, trouble breathing or swallowing, or a combination of symptoms from different body areas such as hives, rashes, or swelling on the skin coupled with vomiting, diarrhea, or abdominal pain. Repeated doses of epinephrine may be necessary. Even if you are uncertain whether a reaction warrants epinephrine, use it right away; the benefits of epinephrine far outweigh the risk. Common side effects of epinephrine may include anxiety, restlessness, dizziness, and shakiness. Rarely, the medication can lead to abnormal heart rate or rhythm, heart attack, sharp increase in blood pressure, and fluid build-up in the lungs. If you have certain pre-existing conditions, you may be at a higher risk for adverse effects with epinephrine. Your allergist will provide you with a written emergency treatment plan that outlines which medications should be administered and when.  Once epinephrine has been administered, immediately call 911 and inform the dispatcher that epinephrine was given and that more may be needed. Other medications may be prescribed to treat symptoms of a food allergy, but it is important to note that there is no substitute for epinephrine: It is the only medication that can reverse the life-threatening symptoms of anaphylaxis. Managing Food Allergies in Children Because fatal and near-fatal food allergy reactions, like other food allergy symptoms, can develop when a child is not with his or her family, parents need to make sure that their child's school, daycare, or other program has a written emergency action plan with instructions on preventing, recognizing, and managing these episodes in class and during activities such as sporting events and field trips. A nonprofit group, Food [employer]ergy  [employer],offers has a list of resources for schools, parents, and students in managing food allergies. If your child has been prescribed an auto-injector, be sure that you and those responsible for supervising your child understand how to use it.

## 2017-04-30 ENCOUNTER — Telehealth: Payer: Self-pay

## 2017-04-30 DIAGNOSIS — W57XXXS Bitten or stung by nonvenomous insect and other nonvenomous arthropods, sequela: Secondary | ICD-10-CM

## 2017-04-30 DIAGNOSIS — R21 Rash and other nonspecific skin eruption: Secondary | ICD-10-CM

## 2017-04-30 MED ORDER — PREDNISONE 10 MG PO TABS: ORAL_TABLET | ORAL | 1 refills | Status: DC

## 2017-04-30 NOTE — Telephone Encounter (Signed)
Has the rash improved? She was not having SOB I do not believe when I saw her so that would be new. Does she have the appt with her allergist? I had put in the noted she had an appt in the upcoming months but did not know exact time frame.

## 2017-04-30 NOTE — Telephone Encounter (Signed)
I can send another round to see if this continues to improve.

## 2017-04-30 NOTE — Telephone Encounter (Signed)
LMTCB

## 2017-04-30 NOTE — Telephone Encounter (Signed)
Pt was seen on 04/21/2017 and was started on Prednisone for alpha-gal allergy. She is c/o fatigue and SOB. She has three days left of her prednisone, and states her sx are usually improved this late in the course. Please advise.

## 2017-04-30 NOTE — Telephone Encounter (Signed)
Pt states her rash has improved, but is still present. The itching has improved. She has not seen her allergist, but she called them and they advised her continue following up with you since you started the treatment on pt. Pt states the receptionist is the one who told her this.

## 2017-05-01 NOTE — Telephone Encounter (Signed)
OK 

## 2017-05-01 NOTE — Telephone Encounter (Signed)
LMTCB  Thanks,  -Joseline 

## 2017-05-01 NOTE — Telephone Encounter (Signed)
Patient reports the rash is better and is actually dry. She is concern about her SOB. Offered the 11:30 appointment and patient stated she was going to call the Allergist since she was in town to see if they can see her. Patient was in no distress.Talking Clear.She does reports she feels tired.

## 2017-06-05 ENCOUNTER — Other Ambulatory Visit: Payer: Self-pay | Admitting: Physician Assistant

## 2017-06-05 DIAGNOSIS — E559 Vitamin D deficiency, unspecified: Secondary | ICD-10-CM

## 2017-07-21 ENCOUNTER — Other Ambulatory Visit: Payer: Self-pay | Admitting: Unknown Physician Specialty

## 2017-07-21 DIAGNOSIS — E041 Nontoxic single thyroid nodule: Secondary | ICD-10-CM

## 2017-08-04 ENCOUNTER — Ambulatory Visit: Payer: 59

## 2017-08-25 ENCOUNTER — Ambulatory Visit
Admission: RE | Admit: 2017-08-25 | Discharge: 2017-08-25 | Disposition: A | Discharge status: Home / Self Care | Payer: 59 | Source: Ambulatory Visit | Attending: Unknown Physician Specialty | Admitting: Unknown Physician Specialty

## 2017-08-25 DIAGNOSIS — E041 Nontoxic single thyroid nodule: Secondary | ICD-10-CM

## 2017-08-25 DIAGNOSIS — R911 Solitary pulmonary nodule: Secondary | ICD-10-CM | POA: Diagnosis not present

## 2017-08-26 ENCOUNTER — Other Ambulatory Visit: Payer: Self-pay | Admitting: Unknown Physician Specialty

## 2017-08-26 DIAGNOSIS — E041 Nontoxic single thyroid nodule: Secondary | ICD-10-CM

## 2017-09-29 ENCOUNTER — Encounter: Payer: Self-pay | Admitting: Family Medicine

## 2017-09-29 ENCOUNTER — Other Ambulatory Visit: Payer: Self-pay | Admitting: Family Medicine

## 2017-09-29 ENCOUNTER — Ambulatory Visit (INDEPENDENT_AMBULATORY_CARE_PROVIDER_SITE_OTHER): Payer: 59 | Admitting: Family Medicine

## 2017-09-29 VITALS — BP 102/80 | HR 72 | Temp 98.5°F | Resp 16 | Wt 160.6 lb

## 2017-09-29 DIAGNOSIS — J4530 Mild persistent asthma, uncomplicated: Secondary | ICD-10-CM

## 2017-09-29 MED ORDER — MOMETASONE FURO-FORMOTEROL FUM 200-5 MCG/ACT IN AERO: 2.0000 | INHALATION_SPRAY | Freq: Two times a day (BID) | RESPIRATORY_TRACT | 5 refills | Status: DC

## 2017-09-29 MED ORDER — PREDNISONE 20 MG PO TABS: ORAL_TABLET | ORAL | 1 refills | Status: DC

## 2017-09-29 NOTE — Patient Instructions (Signed)
Schedule Pro-air at least twice daily or every 6 hours as needed.

## 2017-09-29 NOTE — Progress Notes (Signed)
Subjective:     Patient ID: Hannah [name], female   DOB: 06/16/1966, 52 y.o.   MRN: [id number] Chief Complaint  Patient presents with  . Cough    Patient comes in office today with complaints of chest congestion and cough for 2 weeks or more. Patient states that coughing has triggered her asthma and she has been doing nebulizer treatments at home as well as using her Pro-Air inhaler daily. Patient also reports symptoms of runny/stuffy nose, headache and fatigue. Patient has taken otc Mucinex and Zyrtec and prescription Hycodan at night.    HPI Reports she discontinued Dulera due to cost but would like to have it prescribed again. Has not been using her albuterol inhaler. Cough is dry, non-productive and associated with shortness of breath. On Zyrtec and Singulair for allergy sx. Currently living in [place] but has a "writer's group" here every other week.  Review of Systems     Objective:   Physical Exam  Constitutional: She appears well-developed and well-nourished. No distress.  Ears: T.M's intact without inflammation Sinuses: non-tender Throat: no tonsillar enlargement or exudate Neck: no cervical adenopathy Lungs: End inspiratory wheezes> expiratory wheezees    Assessment:    1. Mild persistent asthma without complication: prednisone and resume Dulera    Plan:    Schedule albuterol twice daily and prn.

## 2017-10-17 IMAGING — US US THYROID
1 series · 13 of 25 positions shown · non-contrast
Comparison: 06/18/2015 and previous back to 01/23/2010

CLINICAL DATA: Thyroid nodule

EXAM:
THYROID ULTRASOUND
TECHNIQUE: Ultrasound examination of the thyroid gland and adjacent soft
tissues was performed.

[Series 1: us thyroid · 0.07mm/px · 13 of 42 slices shown]
[im 1/42]
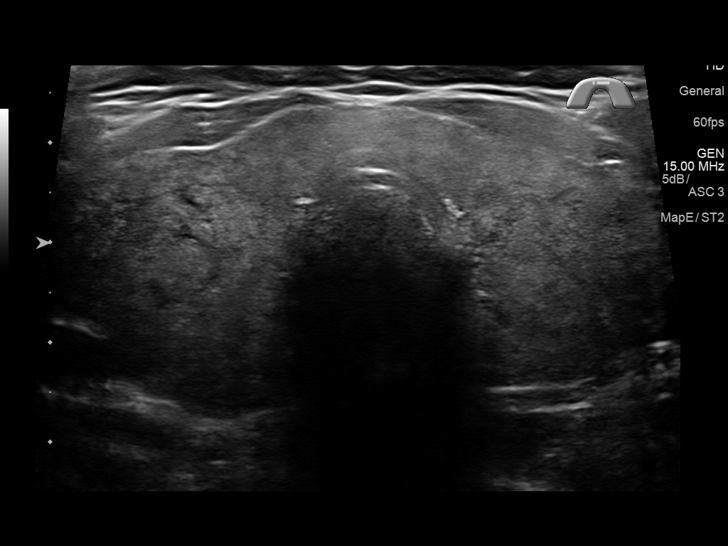
[im 4/42]
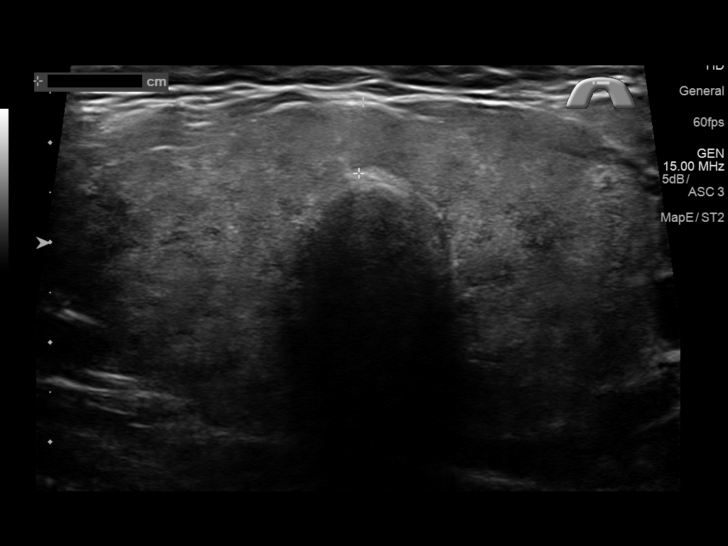
[im 7/42]
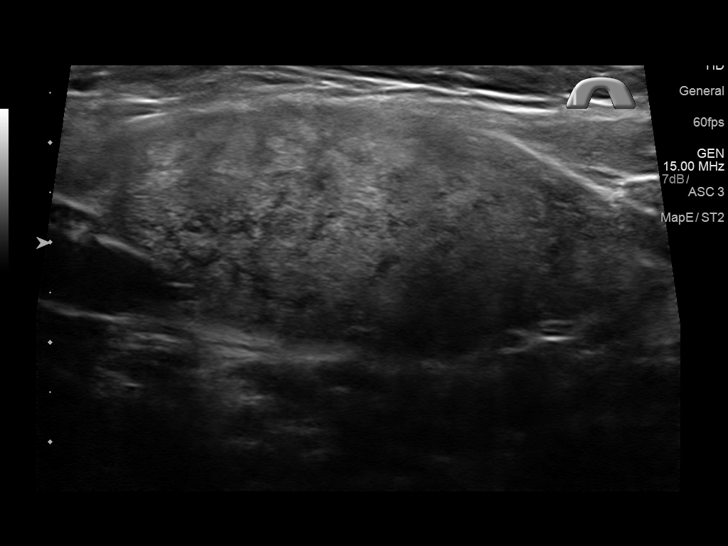
[im 11/42]
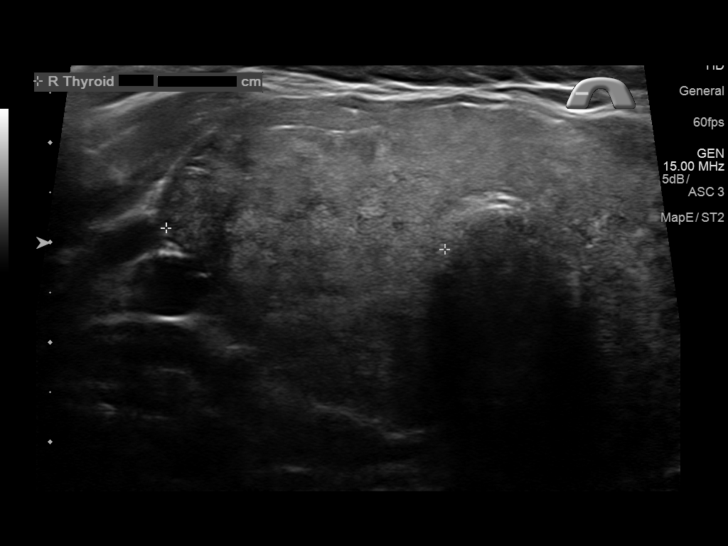
[im 14/42]
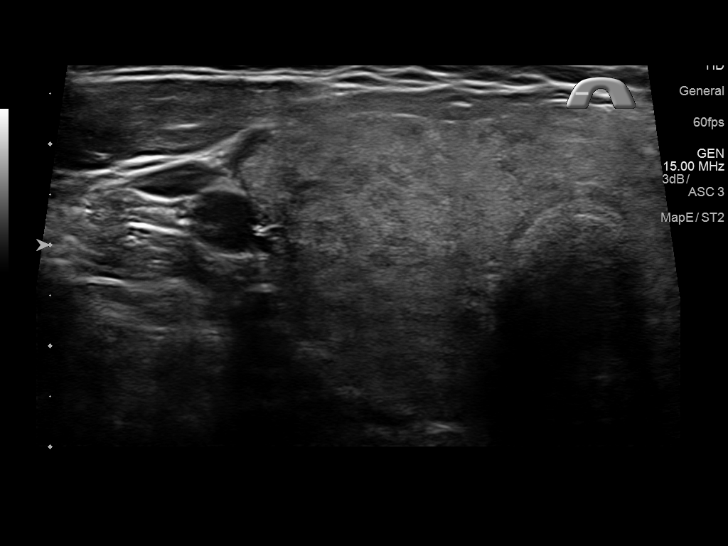
[im 18/42]
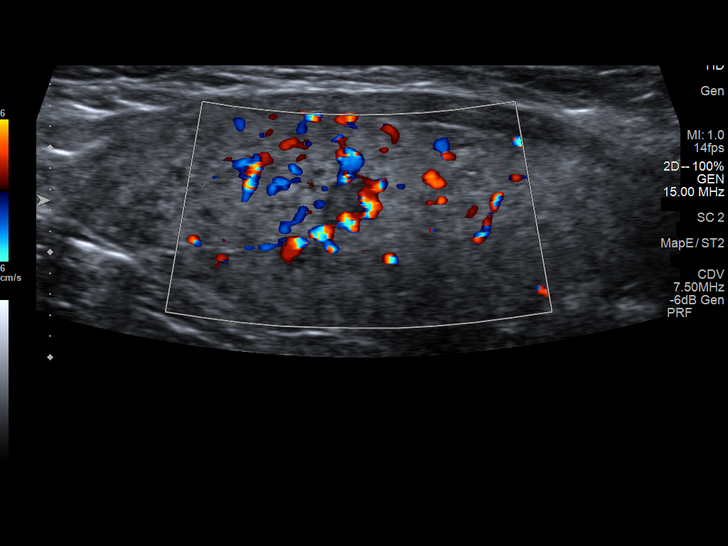
[im 21/42]
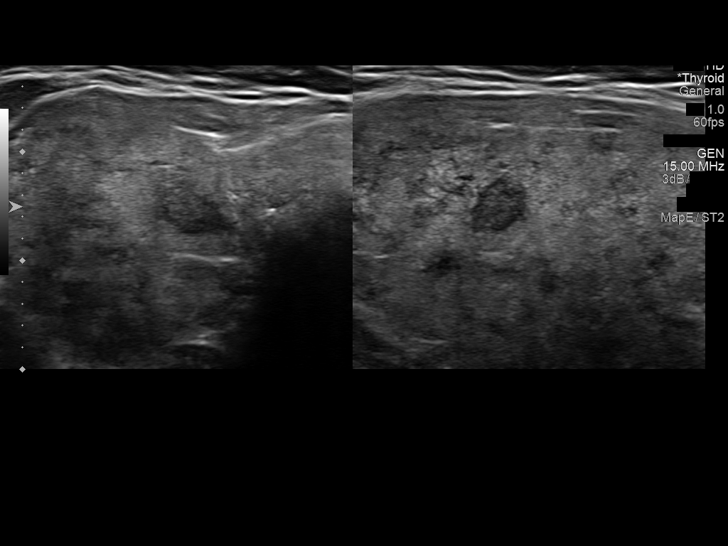
[im 24/42]
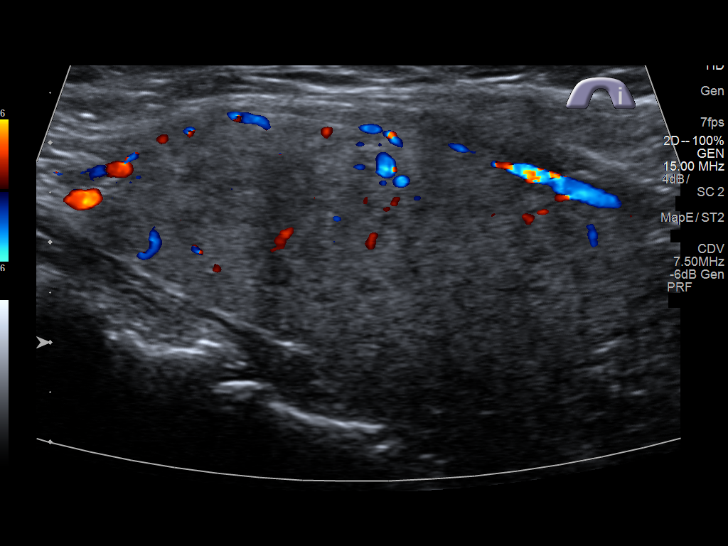
[im 28/42]
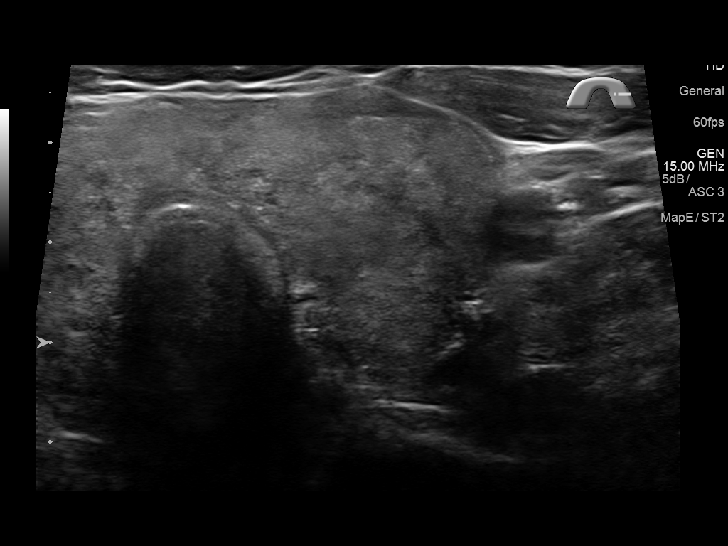
[im 31/42]
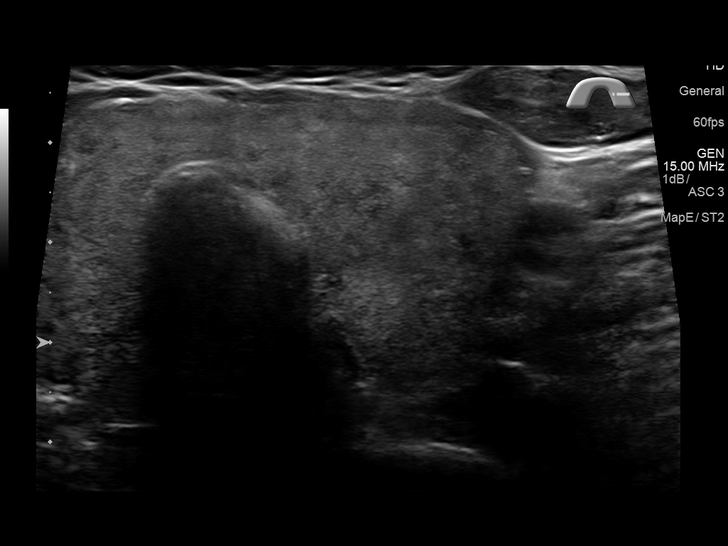
[im 35/42]
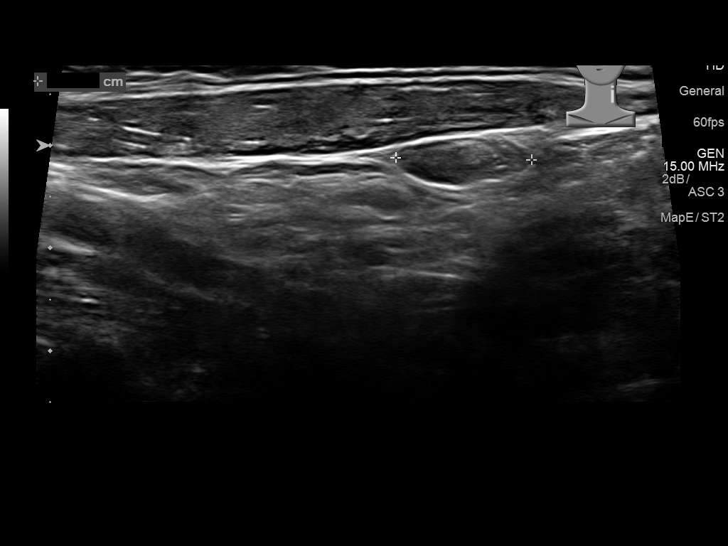
[im 38/42]
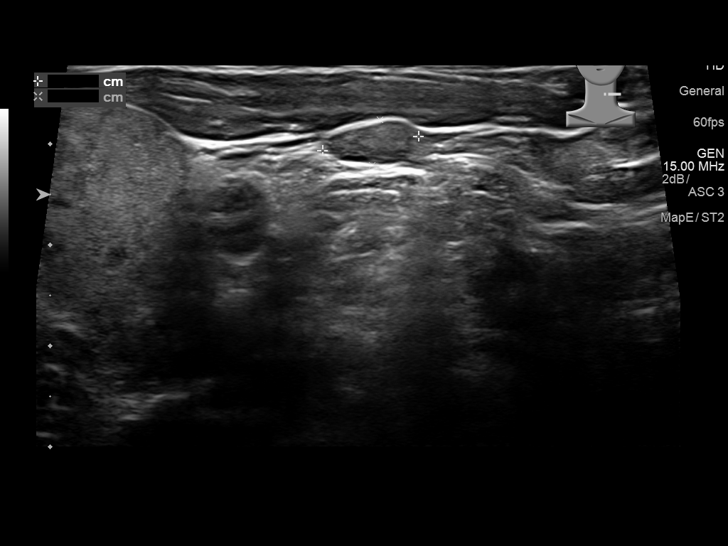
[im 42/42]
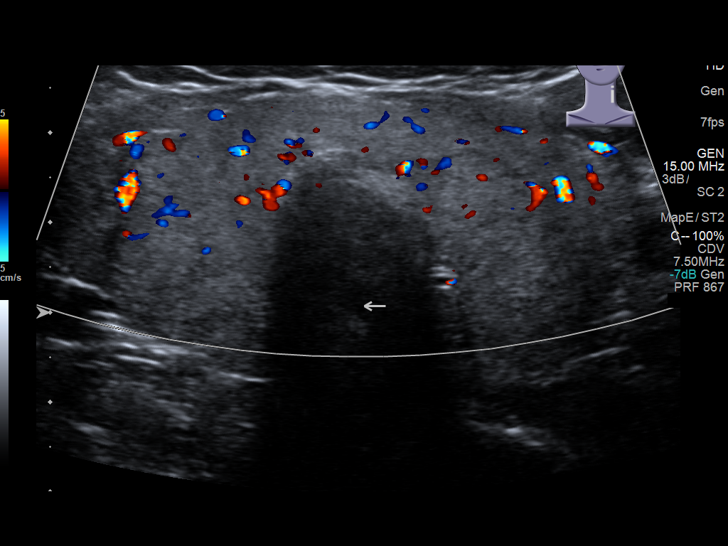

[13 of 25 positions shown; findings below may reference images not displayed]

FINDINGS: Parenchymal Echotexture: Moderately heterogenous

Estimated total number of nodules >/= 1 cm: 0

Number of spongiform nodules >/=  2 cm not described below (TR1): 0

Number of mixed cystic and solid nodules >/= 1.5 cm not described
below (TR2): 0

_________________________________________________________

Isthmus: 0.7 cm thickness, previously

No discrete nodules are identified within the thyroid isthmus.

_________________________________________________________

Right lobe: 6.6 x 3.1 x 2.8 cm (previously 6.5 x 2.7 x 2.6)

Markedly heterogeneous background echotexture.

Solitary 0.6 cm hypoechoic nodule without microcalcifications, mid
lobe (previously 0.9 cm on 01/23/2011).

_________________________________________________________

Left lobe: 5.9 x 2.8 x 2.4 cm (previously 5.8 x 2.6 x 2.2)

No discrete nodules are identified within the left lobe of the
thyroid.

No adenopathy localized.
IMPRESSION: 1. Minimal if any worsening of thyromegaly, with stable solitary
small right nodule for greater than 5 years, almost certainly
benign. No further dedicated imaging follow-up or biopsy
recommended.

The above is in keeping with the ACR TI-RADS recommendations - [HOSPITAL] 6274;[DATE].

## 2018-08-25 ENCOUNTER — Ambulatory Visit: Payer: 59 | Attending: Unknown Physician Specialty

## 2018-12-29 IMAGING — US US THYROID
1 series · 13 of 25 positions shown · non-contrast
Comparison: 06/13/2016

CLINICAL DATA: Prior ultrasound follow-up. Follow-up thyroid
nodule.

EXAM:
THYROID ULTRASOUND
TECHNIQUE: Ultrasound examination of the thyroid gland and adjacent soft
tissues was performed.

[Series 1: us thyroid · 0.07mm/px · 13 of 43 slices shown]
[im 1/43]
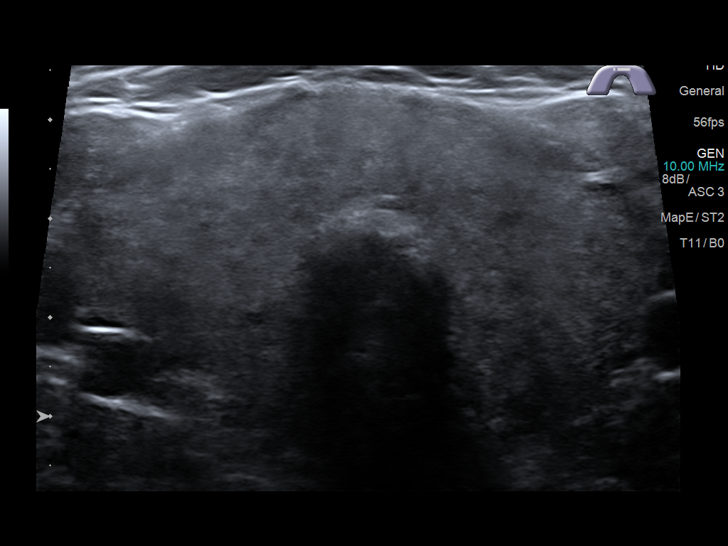
[im 4/43]
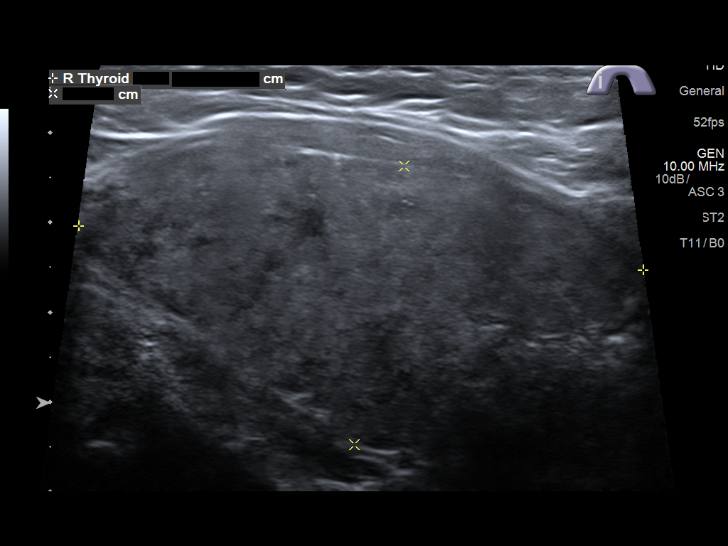
[im 8/43]
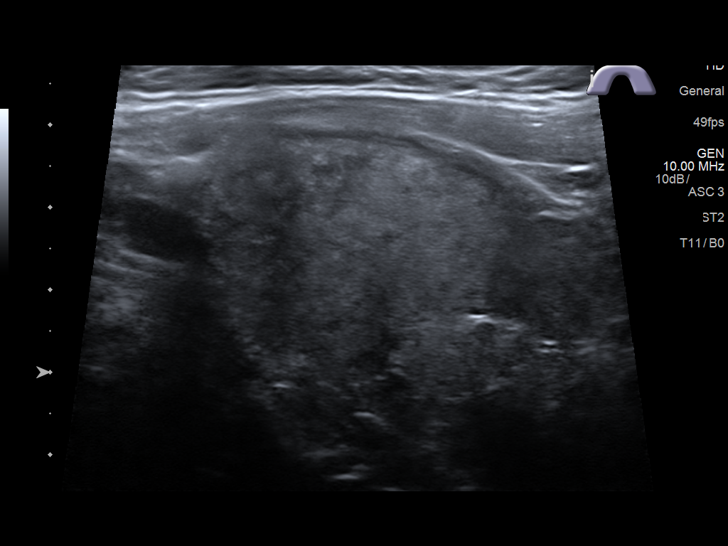
[im 11/43]
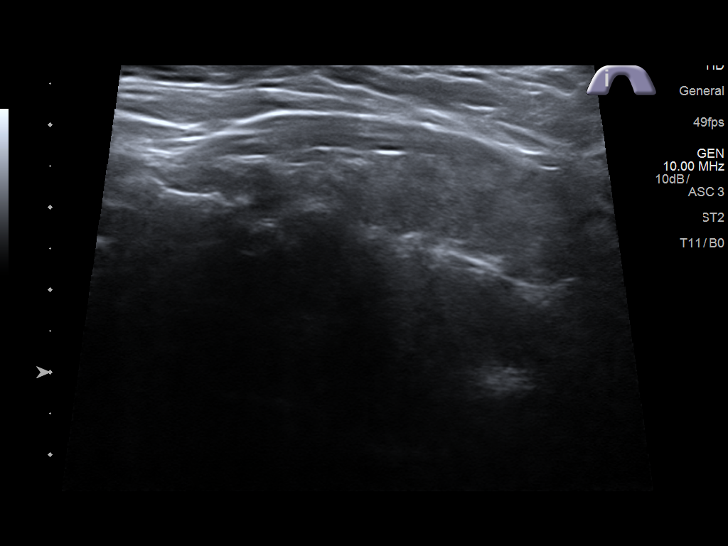
[im 15/43]
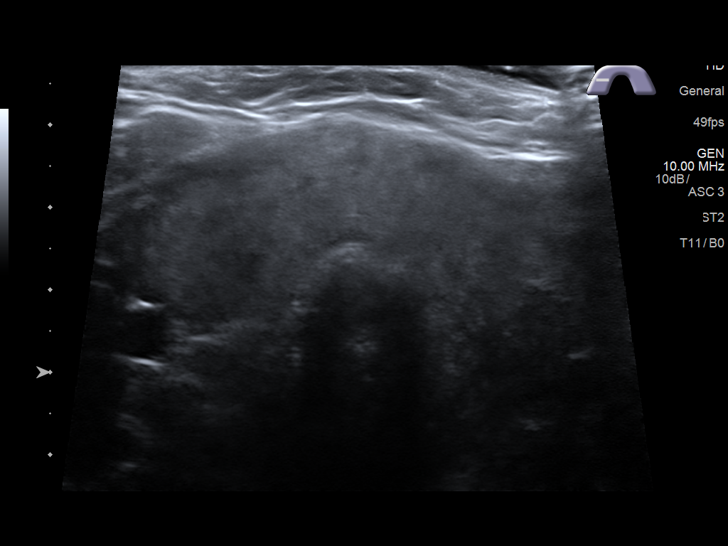
[im 18/43]
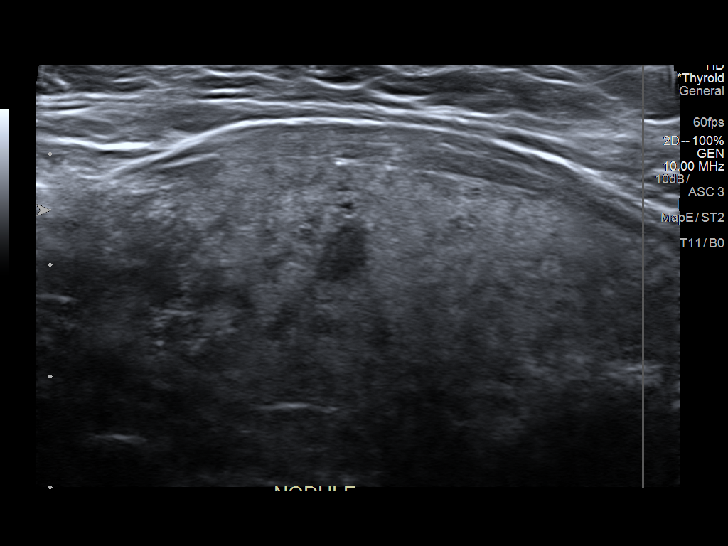
[im 22/43]
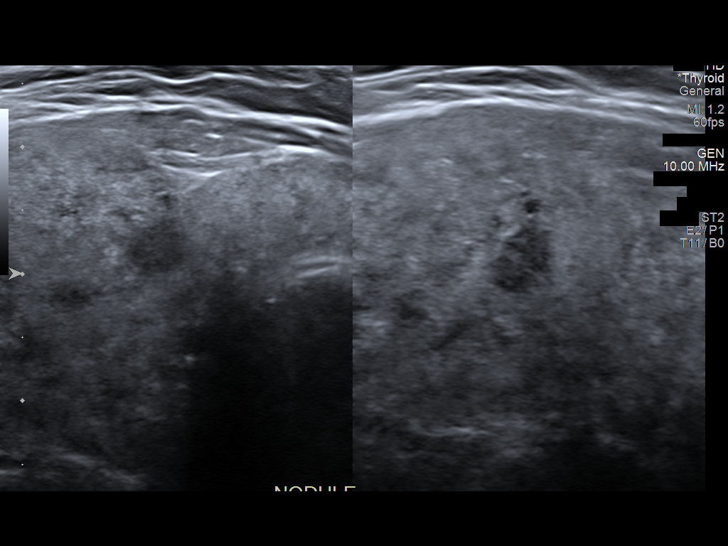
[im 25/43]
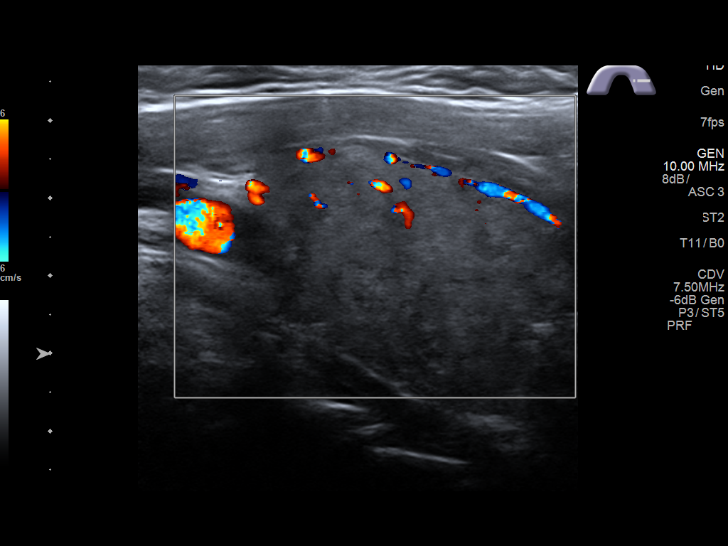
[im 29/43]
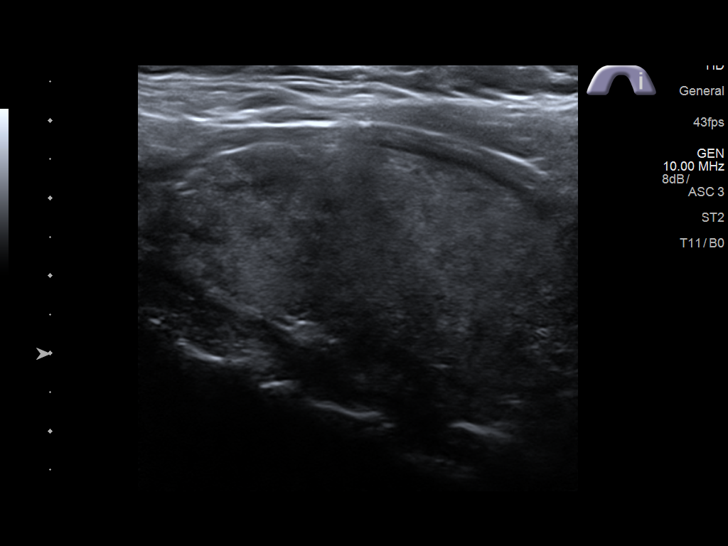
[im 32/43]
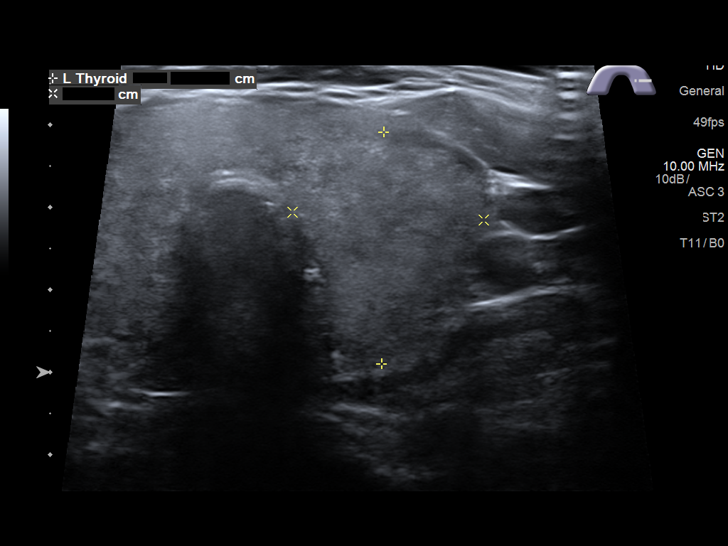
[im 36/43]
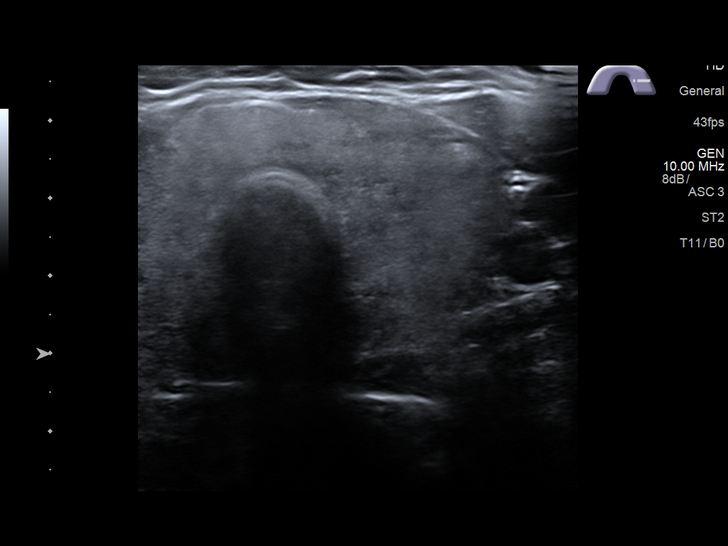
[im 39/43]
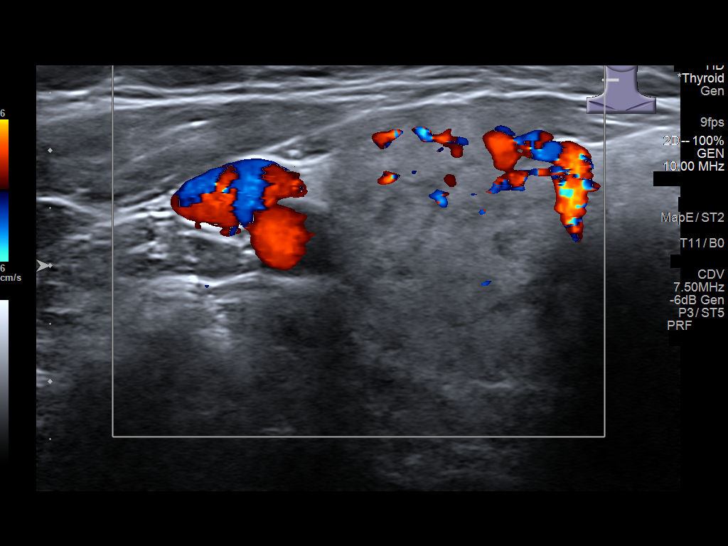
[im 43/43]
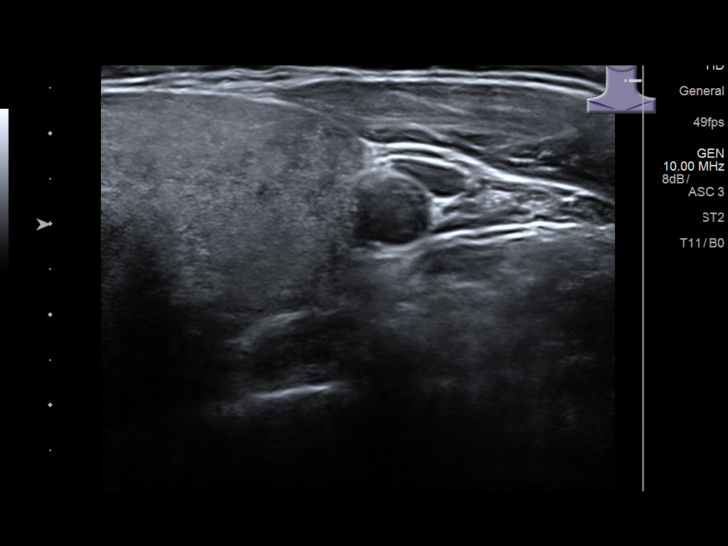

[13 of 25 positions shown; findings below may reference images not displayed]

FINDINGS: Parenchymal Echotexture: Markedly heterogenous

Isthmus: 1.1 cm, previously 0.7 cm

Right lobe: 6.3 x 3.3 x 2.4 cm, previously 6.6 x 3.1 x 2.8 cm

Left lobe: 5.9 x 2.8 x 2.3 cm, previously 5.9 x 2.8 x 2.4 cm

_________________________________________________________

Estimated total number of nodules >/= 1 cm: 0

Number of spongiform nodules >/=  2 cm not described below (TR1): 0

Number of mixed cystic and solid nodules >/= 1.5 cm not described
below (TR2): 0

_________________________________________________________

Nodule # 1:

Prior biopsy: No

Location: Right; Mid

Maximum size: 0.6 cm; Other 2 dimensions: 0.5 x 0.5 cm, previously,
0.6 x 0.6 x 0.5 cm

Composition: solid/almost completely solid (2)

Echogenicity: hypoechoic (2)

Shape: taller-than-wide (3)

Margins: ill-defined (0)

Echogenic foci: none (0)

ACR TI-RADS total points: 7.

ACR TI-RADS risk category:  TR5 (>/= 7 points).

Significant change in size (>/= 20% in two dimensions and minimal
increase of 2 mm): No

Change in features: No

Change in ACR TI-RADS risk category: No

ACR TI-RADS recommendations: *Given size (>/= 0.5 - 0.9 cm) and
appearance, a follow-up ultrasound in 1 year should be considered
based on TI-RADS criteria.

_________________________________________________________
IMPRESSION: Right mid nodule 1 is stable and meets criteria for annual
follow-up.

The above is in keeping with the ACR TI-RADS recommendations - [HOSPITAL] 8668;[DATE].

## 2020-09-22 ENCOUNTER — Emergency Department
Admission: EM | Admit: 2020-09-22 | Discharge: 2020-09-24 | Disposition: A | Discharge status: Other Acute Inpatient Hospital | Payer: 59 | Attending: Emergency Medicine | Admitting: Emergency Medicine

## 2020-09-22 ENCOUNTER — Other Ambulatory Visit: Payer: Self-pay

## 2020-09-22 DIAGNOSIS — Z7951 Long term (current) use of inhaled steroids: Secondary | ICD-10-CM | POA: Insufficient documentation

## 2020-09-22 DIAGNOSIS — X58XXXA Exposure to other specified factors, initial encounter: Secondary | ICD-10-CM | POA: Insufficient documentation

## 2020-09-22 DIAGNOSIS — J4530 Mild persistent asthma, uncomplicated: Secondary | ICD-10-CM | POA: Diagnosis not present

## 2020-09-22 DIAGNOSIS — Y906 Blood alcohol level of 120-199 mg/100 ml: Secondary | ICD-10-CM | POA: Diagnosis not present

## 2020-09-22 DIAGNOSIS — Z20822 Contact with and (suspected) exposure to covid-19: Secondary | ICD-10-CM | POA: Diagnosis not present

## 2020-09-22 DIAGNOSIS — T1491XA Suicide attempt, initial encounter: Secondary | ICD-10-CM | POA: Diagnosis present

## 2020-09-22 DIAGNOSIS — F1099 Alcohol use, unspecified with unspecified alcohol-induced disorder: Secondary | ICD-10-CM | POA: Diagnosis not present

## 2020-09-22 DIAGNOSIS — E02 Subclinical iodine-deficiency hypothyroidism: Secondary | ICD-10-CM | POA: Diagnosis not present

## 2020-09-22 DIAGNOSIS — Z87891 Personal history of nicotine dependence: Secondary | ICD-10-CM | POA: Diagnosis not present

## 2020-09-22 DIAGNOSIS — Z046 Encounter for general psychiatric examination, requested by authority: Secondary | ICD-10-CM | POA: Diagnosis not present

## 2020-09-22 DIAGNOSIS — F322 Major depressive disorder, single episode, severe without psychotic features: Secondary | ICD-10-CM | POA: Insufficient documentation

## 2020-09-22 NOTE — ED Triage Notes (Signed)
Patient arrives from home with [facility] after suicide attempt. [employer] report they found patient in garage in car with car running and garage closed. Pt continues to report SI stating "I don't want to be here anymore." Pt reports stressor being her husband leaving her after 31 years and having an affair. Pt calm in triage. Denies physical pain. It is estimated that patient was in car ~30 min prior to police arrival. [facility] report that patient was alert on their arrival.

## 2020-09-23 DIAGNOSIS — F322 Major depressive disorder, single episode, severe without psychotic features: Secondary | ICD-10-CM | POA: Diagnosis not present

## 2020-09-23 DIAGNOSIS — T1491XA Suicide attempt, initial encounter: Secondary | ICD-10-CM

## 2020-09-23 LAB — COMPREHENSIVE METABOLIC PANEL
ALT: 27 U/L (ref 0–44)
AST: 29 U/L (ref 15–41)
Albumin: 4.6 g/dL (ref 3.5–5.0)
Alkaline Phosphatase: 76 U/L (ref 38–126)
Anion gap: 11 (ref 5–15)
BUN: 9 mg/dL (ref 6–20)
CO2: 20 mmol/L — ABNORMAL LOW (ref 22–32)
Calcium: 9.6 mg/dL (ref 8.9–10.3)
Chloride: 105 mmol/L (ref 98–111)
Creatinine, Ser: 0.76 mg/dL (ref 0.44–1.00)
GFR, Estimated: >60 mL/min (ref >60)
Glucose, Bld: 126 mg/dL — ABNORMAL HIGH (ref 70–99)
Potassium: 3.8 mmol/L (ref 3.5–5.1)
Sodium: 136 mmol/L (ref 135–145)
Total Bilirubin: 1 mg/dL (ref 0.3–1.2)
Total Protein: 8 g/dL (ref 6.5–8.1)

## 2020-09-23 LAB — CBC
HCT: 41.5 % (ref 36.0–46.0)
Hemoglobin: 14.2 g/dL (ref 12.0–15.0)
MCH: 28.4 pg (ref 26.0–34.0)
MCHC: 34.2 g/dL (ref 30.0–36.0)
MCV: 83 fL (ref 80.0–100.0)
Platelets: 286 10*3/uL (ref 150–400)
RBC: 5 MIL/uL (ref 3.87–5.11)
RDW: 11.9 % (ref 11.5–15.5)
WBC: 6.5 10*3/uL (ref 4.0–10.5)
nRBC: 0 % (ref 0.0–0.2)

## 2020-09-23 LAB — RESP PANEL BY RT-PCR (FLU A&B, COVID) ARPGX2
Influenza A by PCR: NEGATIVE
Influenza B by PCR: NEGATIVE
SARS Coronavirus 2 by RT PCR: NEGATIVE

## 2020-09-23 LAB — SALICYLATE LEVEL: Salicylate Lvl: <7 mg/dL — ABNORMAL LOW (ref 7.0–30.0)

## 2020-09-23 LAB — URINE DRUG SCREEN, QUALITATIVE (ARMC ONLY)
Amphetamines, Ur Screen: NOT DETECTED
Barbiturates, Ur Screen: NOT DETECTED
Benzodiazepine, Ur Scrn: NOT DETECTED
Cannabinoid 50 Ng, Ur ~~LOC~~: NOT DETECTED
Cocaine Metabolite,Ur ~~LOC~~: NOT DETECTED
MDMA (Ecstasy)Ur Screen: NOT DETECTED
Methadone Scn, Ur: NOT DETECTED
Opiate, Ur Screen: NOT DETECTED
Phencyclidine (PCP) Ur S: NOT DETECTED
Tricyclic, Ur Screen: NOT DETECTED

## 2020-09-23 LAB — ETHANOL: Alcohol, Ethyl (B): 148 mg/dL — ABNORMAL HIGH (ref <10)

## 2020-09-23 LAB — ACETAMINOPHEN LEVEL: Acetaminophen (Tylenol), Serum: <10 ug/mL — ABNORMAL LOW (ref 10–30)

## 2020-09-23 MED ORDER — BUPROPION HCL ER (XL) 300 MG PO TB24
300.0000 mg | ORAL_TABLET | Freq: Every day | ORAL | Status: DC
  Administered 2020-09-23 – 2020-09-24 (×2): 300 mg via ORAL
  Filled 2020-09-23 (×2): qty 1

## 2020-09-23 MED ORDER — PANTOPRAZOLE SODIUM 40 MG PO TBEC
40.0000 mg | DELAYED_RELEASE_TABLET | Freq: Every day | ORAL | Status: DC
  Administered 2020-09-23 – 2020-09-24 (×2): 40 mg via ORAL
  Filled 2020-09-23 (×2): qty 1

## 2020-09-23 MED ORDER — LEVOTHYROXINE SODIUM 25 MCG PO TABS
50.0000 ug | ORAL_TABLET | Freq: Every day | ORAL | Status: DC
  Administered 2020-09-24: 50 ug via ORAL
  Filled 2020-09-23: qty 2

## 2020-09-23 MED ORDER — BUSPIRONE HCL 10 MG PO TABS
10.0000 mg | ORAL_TABLET | Freq: Three times a day (TID) | ORAL | Status: DC | PRN
  Administered 2020-09-23: 10 mg via ORAL
  Filled 2020-09-23 (×2): qty 1

## 2020-09-23 NOTE — ED Notes (Signed)
Call from pt's older sister, Ventura Sellers, in [place], gave family phone and visitation times to pass to other family  Sister reports: pt has father in [place], brother in [place], 2 children: Justin (28) and Dahlia Client (23)  Asked to relay message that caller is "thinking of you" - msg given to pt

## 2020-09-23 NOTE — ED Notes (Signed)
Pt appears to be sleeping in chair att, lights on, even respirations

## 2020-09-23 NOTE — BH Assessment (Signed)
Comprehensive Clinical Assessment (CCA) Note  09/23/2020 Hannah [name] [id number]  Chief Complaint: Patient is a 55 year old female presenting to [facility] ED under IVC. Per triage note Patient arrives from home with [facility] after suicide attempt. [employer] report they found patient in garage in car with car running and garage closed. Pt continues to report SI stating "I don't want to be here anymore." Pt reports stressor being her husband leaving her after 31 years and having an affair. Pt calm in triage. Denies physical pain. It is estimated that patient was in car ~30 min prior to police arrival. [facility] report that patient was alert on their arrival. During assessment patient appears alert and oriented x4, calm and cooperative, mood appears depressed, affect is flat. Patient reports "I was overwhelmed, I got in my car and told my husband that I would just die." Patient reports having these symptoms since "the first of the year." Patient also reports having this plan "for a while now." Patient reports this being her first attempt at suicide and denies any previous attempts. Patient does see a therapist for her depression and is prescribed medications by her PCP for her depression. Patient also reports no appetite and drinking alcohol today "5-6 beers." Patient BAL is 148. Patient currently denies SI/HI/AH/VH and does not appear to be responding to any internal or external stimuli.   Per Psyc NP Lerry Liner patient is recommended for Inpatient Hospitalization Chief Complaint  Patient presents with  . Suicidal   Visit Diagnosis: Major Depressive Disorder, severe    CCA Screening, Triage and Referral (STR)  Patient Reported Information How did you hear about [place]? Legal System  Referral name: No data recorded Referral phone number: No data recorded  Whom do you see for routine medical problems? Other (Comment)  Practice/Facility Name: No data recorded Practice/Facility Phone Number: No data recorded Name of  Contact: No data recorded Contact Number: No data recorded Contact Fax Number: No data recorded Prescriber Name: No data recorded Prescriber Address (if known): No data recorded  What Is the Reason for Your Visit/Call Today? No data recorded How Long Has This Been Causing You Problems? 1-6 months  What Do You Feel Would Help You the Most Today? Treatment for Depression or other mood problem   Have You Recently Been in Any Inpatient Treatment (Hospital/Detox/Crisis Center/28-Day Program)? No  Name/Location of Program/Hospital:No data recorded How Long Were You There? No data recorded When Were You Discharged? No data recorded  Have You Ever Received Services From [facility] Before? No  Who Do You See at [facility]? No data recorded  Have You Recently Had Any Thoughts About Hurting Yourself? Yes  Are You Planning to Commit Suicide/Harm Yourself At This time? Yes   Have you Recently Had Thoughts About Hurting Someone Karolee Ohs? No  Explanation: No data recorded  Have You Used Any Alcohol or Drugs in the Past 24 Hours? Yes  How Long Ago Did You Use Drugs or Alcohol? No data recorded What Did You Use and How Much? Alcohol, "5-6 beers"   Do You Currently Have a Therapist/Psychiatrist? Yes  Name of Therapist/Psychiatrist: Unknown   Have You Been Recently Discharged From Any Office Practice or Programs? No  Explanation of Discharge From Practice/Program: No data recorded    CCA Screening Triage Referral Assessment Type of Contact: Face-to-Face  Is this Initial or Reassessment? No data recorded Date Telepsych consult ordered in [facility]:  No data recorded Time Telepsych consult ordered in [facility]:  No data  recorded  Patient Reported Information Reviewed? Yes  Patient Left Without Being Seen? No data recorded Reason for Not Completing Assessment: No data recorded  Collateral Involvement: No data recorded  Does Patient Have a Court Appointed Legal Guardian? No data  recorded Name and Contact of Legal Guardian: No data recorded If Minor and Not Living with Parent(s), Who has Custody? No data recorded Is CPS involved or ever been involved? Never  Is APS involved or ever been involved? Never   Patient Determined To Be At Risk for Harm To Self or Others Based on Review of Patient Reported Information or Presenting Complaint? Yes, for Self-Harm  Method: No data recorded Availability of Means: No data recorded Intent: No data recorded Notification Required: No data recorded Additional Information for Danger to Others Potential: No data recorded Additional Comments for Danger to Others Potential: No data recorded Are There Guns or Other Weapons in Your Home? No data recorded Types of Guns/Weapons: No data recorded Are These Weapons Safely Secured?                            No data recorded Who Could Verify You Are Able To Have These Secured: No data recorded Do You Have any Outstanding Charges, Pending Court Dates, Parole/Probation? No data recorded Contacted To Inform of Risk of Harm To Self or Others: No data recorded  Location of Assessment: [facility] ED   Does Patient Present under Involuntary Commitment? Yes  IVC Papers Initial File Date: 09/23/2020   [place] of Residence: [place]   Patient Currently Receiving the Following Services: No data recorded  Determination of Need: Emergent (2 hours)   Options For Referral: No data recorded    CCA Biopsychosocial Intake/Chief Complaint:  Patient presents for a suicide attempt  Current Symptoms/Problems: Patient presents for a suicide attempt   Patient Reported Schizophrenia/Schizoaffective Diagnosis in Past: No   Strengths: Patient is able to communicate her needs  Preferences: Unknown  Abilities: Patient is able to communicate her needs   Type of Services Patient Feels are Needed: Unknown   Initial Clinical Notes/Concerns: None   Mental Health Symptoms Depression:  Change in  energy/activity; Hopelessness; Increase/decrease in appetite; Sleep (too much or little); Worthlessness   Duration of Depressive symptoms: Greater than two weeks   Mania:  None   Anxiety:   None   Psychosis:  None   Duration of Psychotic symptoms: No data recorded  Trauma:  None   Obsessions:  None   Compulsions:  None   Inattention:  None   Hyperactivity/Impulsivity:  N/A   Oppositional/Defiant Behaviors:  None   Emotional Irregularity:  None   Other Mood/Personality Symptoms:  No data recorded   Mental Status Exam Appearance and self-care  Stature:  Average   Weight:  Average weight   Clothing:  Casual   Grooming:  Normal   Cosmetic use:  None   Posture/gait:  Normal   Motor activity:  Not Remarkable   Sensorium  Attention:  Normal   Concentration:  Normal   Orientation:  X5   Recall/memory:  Normal   Affect and Mood  Affect:  Depressed; Flat   Mood:  Depressed   Relating  Eye contact:  Normal   Facial expression:  Depressed   Attitude toward examiner:  Cooperative   Thought and Language  Speech flow: Soft   Thought content:  Appropriate to Mood and Circumstances   Preoccupation:  None   Hallucinations:  None  Organization:  No data recorded  [employer] ServicesExecutive Functions  Fund of Knowledge:  Fair   Intelligence:  Average   Abstraction:  Normal   Judgement:  Poor   Reality Testing:  Realistic   Insight:  Lacking; Poor   Decision Making:  Impulsive   Social Functioning  Social Maturity:  Responsible   Social Judgement:  Normal   Stress  Stressors:  Family conflict; Relationship   Coping Ability:  Normal   Skill Deficits:  None   Supports:  Support needed     Religion: Religion/Spirituality Are You A Religious Person?: No  Leisure/Recreation: Leisure / Recreation Do You Have Hobbies?: No  Exercise/Diet: Exercise/Diet Do You Exercise?: No Have You Gained or Lost A Significant Amount of Weight in the Past Six  Months?: No Do You Follow a Special Diet?: No Do You Have Any Trouble Sleeping?: No   CCA Employment/Education Employment/Work Situation: Employment / Work Situation Employment situation: Employed Has patient ever been in the [employer] and Companymilitary?: No  Education: Education Is Patient Currently Attending School?: No   CCA Family/Childhood History Family and Relationship History: Family history Marital status: Separated Separated, when?: Unknown What types of issues is patient dealing with in the relationship?: Patient is currently separated from husband Additional relationship information: Unknown Are you sexually active?:  (Unknown) What is your sexual orientation?: Heterosexual Has your sexual activity been affected by drugs, alcohol, medication, or emotional stress?: Unknown Does patient have children?:  (Unknown)  Childhood History:  Childhood History By whom was/is the patient raised?: Other (Comment) Additional childhood history information: None reported Description of patient's relationship with caregiver when they were a child: None reported Patient's description of current relationship with people who raised him/her: None reported How were you disciplined when you got in trouble as a child/adolescent?: None reported Does patient have siblings?: No Did patient suffer any verbal/emotional/physical/sexual abuse as a child?: No Did patient suffer from severe childhood neglect?: No Has patient ever been sexually abused/assaulted/raped as an adolescent or adult?: No Was the patient ever a victim of a crime or a disaster?: No Witnessed domestic violence?: No Has patient been affected by domestic violence as an adult?: No  Child/Adolescent Assessment:     CCA Substance Use Alcohol/Drug Use: Alcohol / Drug Use Pain Medications: See MAR Prescriptions: See MAR Over the Counter: See MAR History of alcohol / drug use?: Yes Substance #1 Name of Substance 1: Alcohol 1 - Last Use  / Amount: 09/21/20                       ASAM's:  Six Dimensions of Multidimensional Assessment  Dimension 1:  Acute Intoxication and/or Withdrawal Potential:      Dimension 2:  Biomedical Conditions and Complications:      Dimension 3:  Emotional, Behavioral, or Cognitive Conditions and Complications:     Dimension 4:  Readiness to Change:     Dimension 5:  Relapse, Continued use, or Continued Problem Potential:     Dimension 6:  Recovery/Living Environment:     ASAM Severity Score:    ASAM Recommended Level of Treatment:     Substance use Disorder (SUD)    Recommendations for Services/Supports/Treatments:   Per Psyc NP Rashaun Dixon patient is recommended for Inpatient Hospitalization  DSM5 Diagnoses: Patient Active Problem List   Diagnosis Date Noted  . Anxiety disorder 04/26/2015  . Abnormal cervical cytology 11/30/2014  . Colon polyp 11/30/2014  . Coitalgia 11/30/2014  . Endometriosis 11/30/2014  . Family  history of cardiovascular disease 11/30/2014  . Acid reflux 11/30/2014  . Cannot sleep 11/30/2014  . Headache, migraine 11/30/2014  . Loss of feeling or sensation 11/30/2014  . Subclinical hypothyroidism 11/30/2014  . Current tobacco use 11/30/2014  . Head revolving around 11/30/2014  . Avitaminosis D 11/30/2014  . HYPERSOMNIA 08/03/2007  . DYSPNEA ON EXERTION 07/16/2007  . ABNORMAL PULMONARY TEST RESULTS 07/16/2007  . Mild persistent asthma 06/22/2007    Patient Centered Plan: Patient is on the following Treatment Plan(s):  Depression   Referrals to Alternative Service(s): Referred to Alternative Service(s):   Place:   Date:   Time:    Referred to Alternative Service(s):   Place:   Date:   Time:    Referred to Alternative Service(s):   Place:   Date:   Time:    Referred to Alternative Service(s):   Place:   Date:   Time:     Laniya Friedl A Betania Dizon, LCAS-A

## 2020-09-23 NOTE — ED Provider Notes (Signed)
[facility] Emergency Department Provider Note  ____________________________________________  Time seen: Approximately 3:08 AM  I have reviewed the triage vital signs and the nursing notes.   HISTORY  Chief Complaint Suicidal   HPI Hannah [name] is a 55 y.o. female with a history of depression and anxiety who presents under IVC for suicide attempt.  Patient reports that her husband of 31 years has left her and is currently having an affair with one of her friends.  Patient reports that she moved, got a new job, and has been trying to move on with her life but she feels extremely sad.  She reports that she is having a very hard time coping with the end of the marriage.  This evening she had some alcohol and locked herself in her car in the garage, turned on the car with a closed garage and then texted her husband that she was going to commit suicide.  He called 911.  Patient reports that she was about 25 to 30 minutes in the car in the garage before the police got to her house.  She denies any prior history of suicide attempts.  She denies any shortness of breath or medical complaints at this time.   Past Medical History:  Diagnosis Date  . Allergy   . Anxiety   . Asthma   . Depression   . GERD (gastroesophageal reflux disease)   . Migraine     Patient Active Problem List   Diagnosis Date Noted  . Anxiety disorder 04/26/2015  . Abnormal cervical cytology 11/30/2014  . Colon polyp 11/30/2014  . Coitalgia 11/30/2014  . Endometriosis 11/30/2014  . Family history of cardiovascular disease 11/30/2014  . Acid reflux 11/30/2014  . Cannot sleep 11/30/2014  . Headache, migraine 11/30/2014  . Loss of feeling or sensation 11/30/2014  . Subclinical hypothyroidism 11/30/2014  . Current tobacco use 11/30/2014  . Head revolving around 11/30/2014  . Avitaminosis D 11/30/2014  . HYPERSOMNIA 08/03/2007  . DYSPNEA ON EXERTION 07/16/2007  . ABNORMAL PULMONARY TEST  RESULTS 07/16/2007  . Mild persistent asthma 06/22/2007    Past Surgical History:  Procedure Laterality Date  . ABDOMINAL HYSTERECTOMY  2004  . APPENDECTOMY  2004  . CERVICAL CONIZATION W/BX  11/1995  . CESAREAN SECTION      x2  . laposcophy  05/1996  . TONSILLECTOMY    . TUBAL LIGATION    . WISDOM TOOTH EXTRACTION     x 4    Prior to Admission medications   Medication Sig Start Date End Date Taking? Authorizing Provider  cetirizine (ZYRTEC ALLERGY) 10 MG tablet Take 10 mg by mouth daily.    [provider]  HYDROcodone-homatropine [place]) 5-1.5 MG/5ML syrup  08/07/17   [provider]  ibuprofen (ADVIL,MOTRIN) 800 MG tablet Take 1 tablet (800 mg total) by mouth every 8 (eight) hours as needed. 04/23/16   Jennye Moccasin, MD  ipratropium-albuterol (DUONEB) 0.5-2.5 (3) MG/3ML SOLN Inhale 3 mL by nebulization every six (6) hours as needed (shortness of breath, cough or wheezing). 08/13/17   [provider]  mometasone-formoterol (DULERA) 200-5 MCG/ACT AERO Inhale 2 puffs into the lungs 2 (two) times daily. 09/29/17   Anola Gurney, PA  montelukast (SINGULAIR) 10 MG tablet Take by mouth. Reported on 11/02/2015    [provider]  Omega-3 Fatty Acids (FISH OIL OMEGA-3 PO) Take by mouth.    [provider]  omeprazole (PRILOSEC) 20 MG capsule TAKE 1 CAPSULE (20  MG TOTAL) BY MOUTH DAILY. 08/27/16   Margaretann Loveless, PA-C  predniSONE (DELTASONE) 20 MG tablet One pill twice daily for 5 days 09/29/17   Anola Gurney, [place]  PROAIR HFA 108 (90 BASE) MCG/ACT inhaler Reported on 11/02/2015 12/22/14   [provider]  Vitamin D, Ergocalciferol, (DRISDOL) 50000 units CAPS capsule TAKE 1 CAPSULE BY MOUTH EVERY 7 DAYS 06/05/17   Margaretann Loveless, PA-C    Allergies Cough-cold medicine  [phenir-pe-sod sal-caff cit], Elemental sulfur, Erythromycin, Levofloxacin, Sulfonamide derivatives, Tetracyclines & related, and Tramadol  Family History   Adopted: Yes  Problem Relation Age of Onset  . Arthritis Mother   . Depression Mother   . Hypertension Mother   . Heart disease Mother        CHF  . Kidney disease Mother   . Diabetes Father   . Depression Sister   . Cancer Brother        colon, lungs, liver  . Diabetes Paternal Grandmother     Social History Social History   Tobacco Use  . Smoking status: Former Smoker    Packs/day: 1.00    Years: 9.00    Pack years: 9.00    Types: Cigarettes    Quit date: 12/28/1990    Years since quitting: 29.7  . Smokeless tobacco: Never Used  . Tobacco comment: quit 1992  Substance Use Topics  . Alcohol use: Yes    Comment: occasion "rare"  . Drug use: No    Review of Systems  Constitutional: Negative for fever. Eyes: Negative for visual changes. ENT: Negative for sore throat. Neck: No neck pain  Cardiovascular: Negative for chest pain. Respiratory: Negative for shortness of breath. Gastrointestinal: Negative for abdominal pain, vomiting or diarrhea. Genitourinary: Negative for dysuria. Musculoskeletal: Negative for back pain. Skin: Negative for rash. Neurological: Negative for headaches, weakness or numbness. Psych: + depression and SI. No HI  ____________________________________________   PHYSICAL EXAM:  VITAL SIGNS: ED Triage Vitals [09/22/20 2347]  Enc Vitals Group     BP 138/83     Pulse Rate 94     Resp 18     Temp 98.9 F (37.2 C)     Temp Source Oral     SpO2 98 %     Weight 153 lb (69.4 kg)     Height 4\' 11"  (1.499 m)     Head Circumference      Peak Flow      Pain Score 0     Pain Loc      Pain Edu?      Excl. in GC?     Constitutional: Alert and oriented. Well appearing and in no apparent distress. HEENT:      Head: Normocephalic and atraumatic.         Eyes: Conjunctivae are normal. Sclera is non-icteric.       Mouth/Throat: Mucous membranes are moist.       Neck: Supple with no signs of meningismus. Cardiovascular: Regular rate and  rhythm.  Respiratory: Normal respiratory effort.  Gastrointestinal: Soft, non tender, and non distended. Musculoskeletal: No edema, cyanosis, or erythema of extremities. Neurologic: Normal speech and language. Face is symmetric. Moving all extremities. No gross focal neurologic deficits are appreciated. Skin: Skin is warm, dry and intact. No rash noted. Psychiatric: Mood and affect are normal. Speech and behavior are normal.  ____________________________________________   LABS (all labs ordered are listed, but only abnormal results are displayed)  Labs Reviewed  COMPREHENSIVE METABOLIC PANEL - Abnormal;  Notable for the following components:      Result Value   CO2 20 (*)    Glucose, Bld 126 (*)    All other components within normal limits  ETHANOL - Abnormal; Notable for the following components:   Alcohol, Ethyl (B) 148 (*)    All other components within normal limits  SALICYLATE LEVEL - Abnormal; Notable for the following components:   Salicylate Lvl <7.0 (*)    All other components within normal limits  ACETAMINOPHEN LEVEL - Abnormal; Notable for the following components:   Acetaminophen (Tylenol), Serum <10 (*)    All other components within normal limits  CBC  URINE DRUG SCREEN, QUALITATIVE ([facility] ONLY)  COOXEMETRY PANEL   ____________________________________________  EKG  ED ECG REPORT I, Nita Sickle, the attending physician, personally viewed and interpreted this ECG.  Normal sinus rhythm, rate of 75, normal intervals, normal axis, no ST elevations or depressions. ____________________________________________  RADIOLOGY  none  ____________________________________________   PROCEDURES  Procedure(s) performed: None Procedures Critical Care performed:  None ____________________________________________   INITIAL IMPRESSION / ASSESSMENT AND PLAN / ED COURSE  55 y.o. female with a history of depression and anxiety who presents under IVC for suicide  attempt. Patient drank alcohol and was inside a running car into a closed garage for about 30 min.  Patient is otherwise well-appearing with no hypoxia, no difficulty breathing.  EKG with no signs of ischemia.  Arterial gas showing carboxyhemoglobin of 1.4, methemoglobinemia 0.5.  Patient satting 100% on room air.  Patient arrives under IVC and will remain at this time.  Labs showing alcohol level of 148 but no other significant abnormalities.  Patient evaluated by psychiatry who recommended inpatient admission.   The patient has been placed in psychiatric observation due to the need to provide a safe environment for the patient while obtaining psychiatric consultation and evaluation, as well as ongoing medical and medication management to treat the patient's condition.  The patient has been placed under full IVC at this time.       Please note:  Patient was evaluated in Emergency Department today for the symptoms described in the history of present illness. Patient was evaluated in the context of the global COVID-19 pandemic, which necessitated consideration that the patient might be at risk for infection with the SARS-CoV-2 virus that causes COVID-19. Institutional protocols and algorithms that pertain to the evaluation of patients at risk for COVID-19 are in a state of rapid change based on information released by regulatory bodies including the CDC and federal and state organizations. These policies and algorithms were followed during the patient's care in the ED.  Some ED evaluations and interventions may be delayed as a result of limited staffing during the pandemic.   ____________________________________________   FINAL CLINICAL IMPRESSION(S) / ED DIAGNOSES   Final diagnoses:  Suicide attempt (HCC)      NEW MEDICATIONS STARTED DURING THIS VISIT:  ED Discharge Orders    None       Note:  This document was prepared using Dragon voice recognition software and may include  unintentional dictation errors.    Don Perking, [place], MD 09/23/20 [phone]

## 2020-09-23 NOTE — ED Notes (Signed)
Hourly rounding reveals patient in room. No complaints, stable, in no acute distress. Q15 minute rounds and monitoring via Security Cameras to continue. 

## 2020-09-23 NOTE — ED Notes (Signed)
BEHAVIORAL HEALTH ROUNDING Patient sleeping: No. Patient alert and oriented: yes Behavior appropriate: Yes.  ; If no, describe:   Nutrition and fluids offered: Yes  Toileting and hygiene offered: Yes  Sitter present: no Law enforcement present: Yes   Patient assigned to appropriate care area. Patient oriented to unit/care area: Informed that, for their safety, care areas are designed for safety and monitored by security cameras at all times; and visiting hours explained to patient. Patient verbalizes understanding, and verbal contract for safety obtained.   ED BHU PLACEMENT JUSTIFICATION Is the patient under IVC or is there intent for IVC: Yes.   Is the patient medically cleared: Yes.   Is there vacancy in the ED BHU: Yes.   Is the population mix appropriate for patient: Yes.   Is the patient awaiting placement in inpatient or outpatient setting: Yes.   Has the patient had a psychiatric consult: Yes.   Survey of unit performed for contraband, proper placement and condition of furniture, tampering with fixtures in bathroom, shower, and each patient room: Yes.  ; Findings:   APPEARANCE/BEHAVIOR calm, cooperative and adequate rapport can be established NEURO ASSESSMENT Orientation: time, place and person Hallucinations: No.None noted (Hallucinations) Speech: Normal Gait: normal RESPIRATORY ASSESSMENT Normal expansion.  Clear to auscultation.  No rales, rhonchi, or wheezing. CARDIOVASCULAR ASSESSMENT regular rate and rhythm, S1, S2 normal, no murmur, click, rub or gallop GASTROINTESTINAL ASSESSMENT soft, nontender, BS WNL, no r/g EXTREMITIES normal strength, tone, and muscle mass, no deformities, no erythema, induration, or nodules, no evidence of joint effusion, ROM of all joints is normal, no evidence of joint instability, gait was normal for age, no musculoskeletal defects noted PLAN OF CARE Provide calm/safe environment. Vital signs assessed twice daily. ED BHU Assessment once  each 12-hour shift. Collaborate with intake RN daily or as condition indicates. Assure the ED provider has rounded once each shift. Provide and encourage hygiene. Provide redirection as needed. Assess for escalating behavior; address immediately and inform ED provider.  Assess family dynamic and appropriateness for visitation as needed: Yes.  ; If necessary, describe findings:   Educate the patient/family about BHU procedures/visitation: Yes.  ; If necessary, describe findings:    ENVIRONMENTAL ASSESSMENT Potentially harmful objects out of patient reach: Yes.   Personal belongings secured: Yes.   Patient dressed in hospital provided attire only: Yes.   Plastic bags out of patient reach: Yes.   Patient care equipment (cords, cables, call bells, lines, and drains) shortened, removed, or accounted for: Yes.   Equipment and supplies removed from bottom of stretcher: Yes.   Potentially toxic materials out of patient reach: Yes.   Sharps container removed or out of patient reach: Yes.    Pt declined fluids, given two blankets, pt wants to know when she'll be able to leave, pt appears angry, reports takes Wellbutrin, Buspar BID, and zyrtec

## 2020-09-23 NOTE — ED Notes (Signed)
Breakfast given at this time.  

## 2020-09-23 NOTE — ED Notes (Signed)
Report to include Situation, Background, Assessment, and Recommendations received from Sarah RN. Patient alert and oriented, warm and dry, in no acute distress. Patient denies SI, HI, AVH and pain. Patient made aware of Q15 minute rounds and security cameras for their safety. Patient instructed to come to me with needs or concerns.  

## 2020-09-23 NOTE — ED Notes (Signed)
Pt to RN station to request water "I feel like I'm going to throw up", pt directed to toilet, pt reports not eating all day and cites that as the reason for nausea, pt refused offer of any type of nourishment, pt drank given ice water and returned to room to lay on bed, lights turned off

## 2020-09-23 NOTE — BH Assessment (Signed)
PATIENT BED AVAILABLE AFTER 9AM ON Monday 09/24/20 Please fax completed COVID results to [phone] or [phone] before transfer  Patient has been accepted to [facility].  Patient assigned to [facility] Accepting physician is Dr. Otho Perl.  Call report to [phone].  Representative was [place] .   ER Staff is aware of it:  [facility] ER Secretary  Dr. Don Perking, ER MD  Danelle Earthly Patient's Nurse     Address: [address], [place] [address]  Patient must check in at the [facility] for temperature screening

## 2020-09-23 NOTE — ED Provider Notes (Signed)
Emergency Medicine Observation Re-evaluation Note  Hannah [name] is a 55 y.o. female, seen on rounds today.  Pt initially presented to the ED for complaints of Suicidal Currently, the patient is ambulatory in the day room without concern.  She does report that she would like to restart her levothyroxine Wellbutrin and BuSpar.  Nursing working to collect those doses from her pharmacy [facility].  Physical Exam  BP 133/84 (BP Location: Left Arm)   Pulse 84   Temp (!) 97.4 F (36.3 C) (Oral)   Resp 18   Ht 4\' 11"  (1.499 m)   Wt 69.4 kg   SpO2 100%   BMI 30.90 kg/m  Physical Exam General: Alert, ambulatory no distress in day room Cardiac: Warm well perfused Lungs: No distress speaks in full clear sentences Psych: Expresses remorse about her suicide attempt, and also reports that she feels like alcohol fueled much of that.  She tells me she no longer feels suicidal  ED Course / MDM  EKG:   I have reviewed the labs performed to date as well as medications administered while in observation.  Recent changes in the last 24 hours include no major, patient is expressing some quite a bit of remorse this morning.  Plan  Current plan is for psychiatric disposition. Patient is under full IVC at this time.     , MD 09/23/20 1045

## 2020-09-23 NOTE — ED Notes (Signed)
TTS called to notify that pt would be accepted to [facility] on Monday

## 2020-09-23 NOTE — ED Notes (Signed)
Report given to Noel, RN 

## 2020-09-23 NOTE — ED Notes (Signed)
Pt given lunch tray.

## 2020-09-23 NOTE — ED Notes (Addendum)
Pt's children wanting to visit. Aware of 15 min total visit policy. One son brought back at a time, wanded down, and visited for half the time. This RN spoke with husband as well and was updated on plan of care.

## 2020-09-23 NOTE — Consult Note (Signed)
[facility] Face-to-Face Psychiatry Consult   Reason for Consult:  Referring Physician:  Dr. Don Perking Patient Identification: Hannah [name] MRN:  [id number] Principal Diagnosis: Suicide attempt [facility]) Diagnosis:  Principal Problem:   Suicide attempt [facility]) Active Problems:   MDD (major depressive disorder), severe (HCC)   Total Time spent with patient: 1 hour  Subjective:   Hannah [name] is a 55 y.o. female patient admitted with per triage nurse, patient arrives from home with [facility] after suicide attempt. [employer] report they found patient in garage in car with car running and garage closed. Pt continues to report SI stating "I don't want to be here anymore." Pt reports stressor being her husband leaving her after 31 years and having an affair. Pt calm in triage. Denies physical pain. It is estimated that patient was in car ~30 min prior to police arrival. [facility] report that patient was alert on their arrival.   HPI: During evaluation Hannah [name] is alert/oriented x 4; somber/cooperative; and mood is congruent with affect.  She does not appear to be responding to internal/external stimuli or delusional thoughts.  Patient is still endorsing suicidal ideation suicidal and denying homicidal ideation.  Patient does not appear to be psychotic and is not displaying paranoid tendencies.  Patient is upset upon finding out that she will be staying inpatient.  Past Psychiatric History: No known history Risk to Self:  Yes Risk to Others:  No Prior Inpatient Therapy:  No Prior Outpatient Therapy:  No  Past Medical History:  Past Medical History:  Diagnosis Date  . Allergy   . Anxiety   . Asthma   . Depression   . GERD (gastroesophageal reflux disease)   . Migraine     Past Surgical History:  Procedure Laterality Date  . ABDOMINAL HYSTERECTOMY  2004  . APPENDECTOMY  2004  . CERVICAL CONIZATION W/BX  11/1995  . CESAREAN SECTION      x2  . laposcophy  05/1996  . TONSILLECTOMY    . TUBAL LIGATION     . WISDOM TOOTH EXTRACTION     x 4   Family History:  Family History  Adopted: Yes  Problem Relation Age of Onset  . Arthritis Mother   . Depression Mother   . Hypertension Mother   . Heart disease Mother        CHF  . Kidney disease Mother   . Diabetes Father   . Depression Sister   . Cancer Brother        colon, lungs, liver  . Diabetes Paternal Grandmother    Family Psychiatric  History: Unknown Social History:  Social History   Substance and Sexual Activity  Alcohol Use Yes   Comment: occasion "rare"     Social History   Substance and Sexual Activity  Drug Use No    Social History   Socioeconomic History  . Marital status: Married    Spouse name: Not on file  . Number of children: Not on file  . Years of education: Not on file  . Highest education level: Not on file  Occupational History  . Not on file  Tobacco Use  . Smoking status: Former Smoker    Packs/day: 1.00    Years: 9.00    Pack years: 9.00    Types: Cigarettes    Quit date: 12/28/1990    Years since quitting: 29.7  . Smokeless tobacco: Never Used  . Tobacco comment: quit 1992  Substance and Sexual Activity  . Alcohol use:  Yes    Comment: occasion "rare"  . Drug use: No  . Sexual activity: Not Currently    Partners: Male    Birth control/protection: Surgical  Other Topics Concern  . Not on file  Social History Narrative  . Not on file   Social Determinants of Health   Financial Resource Strain: Not on file  Food Insecurity: Not on file  Transportation Needs: Not on file  Physical Activity: Not on file  Stress: Not on file  Social Connections: Not on file   Additional Social History:    Allergies:   Allergies  Allergen Reactions  . Cough-Cold Medicine  [Phenir-Pe-Sod Sal-Caff Cit] Itching  . Elemental Sulfur   . Erythromycin   . Levofloxacin     headache, chest discomfort, insomnia  . Sulfonamide Derivatives   . Tetracyclines & Related   . Tramadol     Lips and mouth  numbness.    Labs:  Results for orders placed or performed during the hospital encounter of 09/22/20 (from the past 48 hour(s))  Comprehensive metabolic panel     Status: Abnormal   Collection Time: 09/22/20 11:51 PM  Result Value Ref Range   Sodium 136 135 - 145 mmol/L   Potassium 3.8 3.5 - 5.1 mmol/L   Chloride 105 98 - 111 mmol/L   CO2 20 (L) 22 - 32 mmol/L   Glucose, Bld 126 (H) 70 - 99 mg/dL    Comment: Glucose reference range applies only to samples taken after fasting for at least 8 hours.   BUN 9 6 - 20 mg/dL   Creatinine, Ser 6.96 0.44 - 1.00 mg/dL   Calcium 9.6 8.9 - 29.5 mg/dL   Total Protein 8.0 6.5 - 8.1 g/dL   Albumin 4.6 3.5 - 5.0 g/dL   AST 29 15 - 41 U/L   ALT 27 0 - 44 U/L   Alkaline Phosphatase 76 38 - 126 U/L   Total Bilirubin 1.0 0.3 - 1.2 mg/dL   GFR, Estimated >28 >41 mL/min    Comment: (NOTE) Calculated using the CKD-EPI Creatinine Equation (2021)    Anion gap 11 5 - 15    Comment: Performed at [facility], [address], [place], [place] [address]  Ethanol     Status: Abnormal   Collection Time: 09/22/20 11:51 PM  Result Value Ref Range   Alcohol, Ethyl (B) 148 (H) <10 mg/dL    Comment: (NOTE) Lowest detectable limit for serum alcohol is 10 mg/dL.  For medical purposes only. Performed at [facility], [address] [place], [place] [address]   Salicylate level     Status: Abnormal   Collection Time: 09/22/20 11:51 PM  Result Value Ref Range   Salicylate Lvl <7.0 (L) 7.0 - 30.0 mg/dL    Comment: Performed at [facility], [address] [place], [place] [address]  Acetaminophen level     Status: Abnormal   Collection Time: 09/22/20 11:51 PM  Result Value Ref Range   Acetaminophen (Tylenol), Serum <10 (L) 10 - 30 ug/mL    Comment: (NOTE) Therapeutic concentrations vary significantly. A range of 10-30 ug/mL  may be an effective concentration for many patients. However, some  are best treated at concentrations  outside of this range. Acetaminophen concentrations >150 ug/mL at 4 hours after ingestion  and >50 ug/mL at 12 hours after ingestion are often associated with  toxic reactions.  Performed at [facility], [address] [place], [place] [address]   cbc  Status: None   Collection Time: 09/22/20 11:51 PM  Result Value Ref Range   WBC 6.5 4.0 - 10.5 K/uL   RBC 5.00 3.87 - 5.11 MIL/uL   Hemoglobin 14.2 12.0 - 15.0 g/dL   HCT 24.5 80.9 - 98.3 %   MCV 83.0 80.0 - 100.0 fL   MCH 28.4 26.0 - 34.0 pg   MCHC 34.2 30.0 - 36.0 g/dL   RDW 38.2 50.5 - 39.7 %   Platelets 286 150 - 400 K/uL   nRBC 0.0 0.0 - 0.2 %    Comment: Performed at [facility], [address], [place], [place] [address]  Urine Drug Screen, Qualitative     Status: None   Collection Time: 09/22/20 11:51 PM  Result Value Ref Range   Tricyclic, Ur Screen NONE DETECTED NONE DETECTED   Amphetamines, Ur Screen NONE DETECTED NONE DETECTED   MDMA (Ecstasy)Ur Screen NONE DETECTED NONE DETECTED   Cocaine Metabolite,Ur [place] NONE DETECTED NONE DETECTED   Opiate, Ur Screen NONE DETECTED NONE DETECTED   Phencyclidine (PCP) Ur S NONE DETECTED NONE DETECTED   Cannabinoid 50 Ng, Ur [place] NONE DETECTED NONE DETECTED   Barbiturates, Ur Screen NONE DETECTED NONE DETECTED   Benzodiazepine, Ur Scrn NONE DETECTED NONE DETECTED   Methadone Scn, Ur NONE DETECTED NONE DETECTED    Comment: (NOTE) Tricyclics + metabolites, urine    Cutoff 1000 ng/mL Amphetamines + metabolites, urine  Cutoff 1000 ng/mL MDMA (Ecstasy), urine              Cutoff 500 ng/mL Cocaine Metabolite, urine          Cutoff 300 ng/mL Opiate + metabolites, urine        Cutoff 300 ng/mL Phencyclidine (PCP), urine         Cutoff 25 ng/mL Cannabinoid, urine                 Cutoff 50 ng/mL Barbiturates + metabolites, urine  Cutoff 200 ng/mL Benzodiazepine, urine              Cutoff 200 ng/mL Methadone, urine                   Cutoff 300 ng/mL  The urine  drug screen provides only a preliminary, unconfirmed analytical test result and should not be used for non-medical purposes. Clinical consideration and professional judgment should be applied to any positive drug screen result due to possible interfering substances. A more specific alternate chemical method must be used in order to obtain a confirmed analytical result. Gas chromatography / mass spectrometry (GC/MS) is the preferred confirm atory method. Performed at [facility], [address] [place], [place] [address]   Resp Panel by RT-PCR (Flu A&B, Covid) Nasopharyngeal Swab     Status: None   Collection Time: 09/23/20  3:15 AM   Specimen: Nasopharyngeal Swab; Nasopharyngeal(NP) swabs in vial transport medium  Result Value Ref Range   SARS Coronavirus 2 by RT PCR NEGATIVE NEGATIVE    Comment: (NOTE) SARS-CoV-2 target nucleic acids are NOT DETECTED.  The SARS-CoV-2 RNA is generally detectable in upper respiratory specimens during the acute phase of infection. The lowest concentration of SARS-CoV-2 viral copies this assay can detect is 138 copies/mL. A negative result does not preclude SARS-Cov-2 infection and should not be used as the sole basis for treatment or other patient management decisions. A negative result may occur with  improper specimen collection/handling, submission of specimen other than nasopharyngeal swab, presence of  viral mutation(s) within the areas targeted by this assay, and inadequate number of viral copies(<138 copies/mL). A negative result must be combined with clinical observations, patient history, and epidemiological information. The expected result is Negative.  Fact Sheet for Patients:  BloggerCourse.comhttps://www.fda.gov/media/152166/download  Fact Sheet for Healthcare Providers:  SeriousBroker.ithttps://www.fda.gov/media/152162/download  This test is no t yet approved or cleared by the [place] FDA and  has been authorized for detection and/or diagnosis of  SARS-CoV-2 by FDA under an Emergency Use Authorization (EUA). This EUA will remain  in effect (meaning this test can be used) for the duration of the COVID-19 declaration under Section 564(b)(1) of the Act, 21 U.S.C.section 360bbb-3(b)(1), unless the authorization is terminated  or revoked sooner.       Influenza A by PCR NEGATIVE NEGATIVE   Influenza B by PCR NEGATIVE NEGATIVE    Comment: (NOTE) The Xpert Xpress SARS-CoV-2/FLU/RSV plus assay is intended as an aid in the diagnosis of influenza from Nasopharyngeal swab specimens and should not be used as a sole basis for treatment. Nasal washings and aspirates are unacceptable for Xpert Xpress SARS-CoV-2/FLU/RSV testing.  Fact Sheet for Patients: BloggerCourse.comhttps://www.fda.gov/media/152166/download  Fact Sheet for Healthcare Providers: SeriousBroker.ithttps://www.fda.gov/media/152162/download  This test is not yet approved or cleared by the [place] FDA and has been authorized for detection and/or diagnosis of SARS-CoV-2 by FDA under an Emergency Use Authorization (EUA). This EUA will remain in effect (meaning this test can be used) for the duration of the COVID-19 declaration under Section 564(b)(1) of the Act, 21 U.S.C. section 360bbb-3(b)(1), unless the authorization is terminated or revoked.  Performed at [facility], [address] [place], [place] [address]     No current facility-administered medications for this encounter.   Current Outpatient Medications  Medication Sig Dispense Refill  . cetirizine (ZYRTEC ALLERGY) 10 MG tablet Take 10 mg by mouth daily.    Marland Kitchen. HYDROcodone-homatropine (HYCODAN) 5-1.5 MG/5ML syrup     . ibuprofen (ADVIL,MOTRIN) 800 MG tablet Take 1 tablet (800 mg total) by mouth every 8 (eight) hours as needed. 30 tablet 0  . ipratropium-albuterol (DUONEB) 0.5-2.5 (3) MG/3ML SOLN Inhale 3 mL by nebulization every six (6) hours as needed (shortness of breath, cough or wheezing).    . mometasone-formoterol  (DULERA) 200-5 MCG/ACT AERO Inhale 2 puffs into the lungs 2 (two) times daily. 13 g 5  . montelukast (SINGULAIR) 10 MG tablet Take by mouth. Reported on 11/02/2015    . Omega-3 Fatty Acids (FISH OIL OMEGA-3 PO) Take by mouth.    Marland Kitchen. omeprazole (PRILOSEC) 20 MG capsule TAKE 1 CAPSULE (20 MG TOTAL) BY MOUTH DAILY. 30 capsule 5  . predniSONE (DELTASONE) 20 MG tablet One pill twice daily for 5 days 10 tablet 1  . PROAIR HFA 108 (90 BASE) MCG/ACT inhaler Reported on 11/02/2015  0  . Vitamin D, Ergocalciferol, (DRISDOL) 50000 units CAPS capsule TAKE 1 CAPSULE BY MOUTH EVERY 7 DAYS 4 capsule 4    Musculoskeletal: Strength & Muscle Tone: within normal limits Gait & Station: normal Patient leans: N/A   Psychiatric Specialty Exam:  Presentation  General Appearance: Casual  Eye Contact:Fair  Speech:Clear and Coherent  Speech Volume:Normal  Handedness:Right   Mood and Affect  Mood:Depressed; Dysphoric; Hopeless; Irritable  Affect:Congruent   Thought Process  Thought Processes:Coherent  Descriptions of Associations:Intact  Orientation:Full (Time, Place and Person)  Thought Content:Logical  History of Schizophrenia/Schizoaffective disorder:No  Duration of Psychotic Symptoms:No data recorded Hallucinations:Hallucinations: None  Ideas of Reference:None  Suicidal Thoughts:Suicidal Thoughts: Yes, Active  SI Active Intent and/or Plan: With Plan; With Means to Carry Out; With Intent  Homicidal Thoughts:Homicidal Thoughts: No   Sensorium  Memory:Immediate Good  Judgment:Fair  Insight:Fair   Executive Functions  Concentration:Fair  Attention Span:Fair  Recall:Fair  Fund of Knowledge:Fair  Language:Fair   Psychomotor Activity  Psychomotor Activity:Psychomotor Activity: Normal   Assets  Assets:Communication Skills; Housing; Financial Resources/Insurance   Sleep  Sleep:Sleep: Poor   Physical Exam: Physical Exam Vitals and nursing note reviewed.  HENT:      Head: Normocephalic and atraumatic.     Nose: Nose normal.     Mouth/Throat:     Mouth: Mucous membranes are moist.  Eyes:     Pupils: Pupils are equal, round, and reactive to light.  Pulmonary:     Effort: Pulmonary effort is normal.  Musculoskeletal:        General: Normal range of motion.     Cervical back: Normal range of motion.  Skin:    General: Skin is warm and dry.  Neurological:     General: No focal deficit present.     Mental Status: She is alert and oriented to person, place, and time.  Psychiatric:        Attention and Perception: Attention and perception normal.        Mood and Affect: Mood is depressed.        Speech: Speech normal.        Behavior: Behavior is cooperative.        Thought Content: Thought content includes suicidal ideation.        Cognition and Memory: Cognition and memory normal.        Judgment: Judgment is impulsive and inappropriate.    Review of Systems  Psychiatric/Behavioral: Positive for depression and suicidal ideas. The patient has insomnia.   All other systems reviewed and are negative.  Blood pressure 138/83, pulse 94, temperature 98.9 F (37.2 C), temperature source Oral, resp. rate 18, height  (1.499 m), weight 69.4 kg, SpO2 98 %. Body mass index is 30.9 kg/m.  Treatment Plan Summary: Daily contact with patient to assess and evaluate symptoms and progress in treatment and Medication management  -NICLOE FRONTERA was admitted to [facility], crisis management, and stabilization. -Routine labs; which include CBC, CMP, UA, ETOH, Urine pregnancy, HCG, and UDS were reviewed  -medication management:  -Will maintain observation checks every 15 minutes for safety. -Psychosocial education regarding relapse prevention and self-care; Social and communication  -Social work will consult with family for collateral information and discuss discharge and follow up plan.  Disposition: Recommend psychiatric Inpatient admission when medically  cleared. Supportive therapy provided about ongoing stressors. Discussed crisis plan, support from social network, calling 911, coming to the Emergency Department, and calling Suicide Hotline.  Jearld Lesch, NP 09/23/2020 5:06 AM

## 2020-09-23 NOTE — ED Notes (Signed)
Pt given dinner tray; given ice water instead of juice per request.

## 2020-09-23 NOTE — ED Notes (Signed)
Spoke with the pt , informed of the LCSW./TTS notes that states she is accepted to [facility] tomorrow after 9am

## 2020-09-23 NOTE — ED Notes (Signed)
Family brought 3 paperback books for pt. Books searched. Given to pt.

## 2020-09-23 NOTE — ED Notes (Signed)
Pt's phone and keys given to son at pt's request and taken home.

## 2020-09-23 NOTE — Progress Notes (Signed)
CO-OXIMETRY PANEL RESULTS  tHb   14.2      Ref: 12.0 - 16.0 COHb 1.4     Ref:  0.5 - 1.5 MetHb  0.5    Ref.   0.0 - 1.5 sO2     100      Unable to enter results into Epic at this time

## 2020-09-23 NOTE — ED Notes (Signed)
TTS and Psych NP speaking with patient. 

## 2020-09-23 NOTE — BH Assessment (Signed)
Referral information for Psychiatric Hospitalization faxed to;   [facility] [phone] -or- [phone]),

## 2020-09-23 NOTE — ED Notes (Signed)
Pt provided snack and drinks

## 2020-09-24 ENCOUNTER — Encounter (HOSPITAL_COMMUNITY): Payer: Self-pay | Admitting: Urology

## 2020-09-24 ENCOUNTER — Other Ambulatory Visit: Payer: Self-pay

## 2020-09-24 ENCOUNTER — Inpatient Hospital Stay (HOSPITAL_COMMUNITY)
Admission: AD | Admit: 2020-09-24 | Discharge: 2020-10-02 | DRG: 885 | Disposition: A | Discharge status: Home / Self Care | Payer: 59 | Source: Intra-hospital | Attending: Psychiatry | Admitting: Psychiatry

## 2020-09-24 DIAGNOSIS — F419 Anxiety disorder, unspecified: Secondary | ICD-10-CM | POA: Diagnosis present

## 2020-09-24 DIAGNOSIS — Z20822 Contact with and (suspected) exposure to covid-19: Secondary | ICD-10-CM | POA: Diagnosis present

## 2020-09-24 DIAGNOSIS — T5892XA Toxic effect of carbon monoxide from unspecified source, intentional self-harm, initial encounter: Secondary | ICD-10-CM | POA: Diagnosis present

## 2020-09-24 DIAGNOSIS — Z818 Family history of other mental and behavioral disorders: Secondary | ICD-10-CM | POA: Diagnosis not present

## 2020-09-24 DIAGNOSIS — F332 Major depressive disorder, recurrent severe without psychotic features: Principal | ICD-10-CM | POA: Diagnosis present

## 2020-09-24 DIAGNOSIS — Z79899 Other long term (current) drug therapy: Secondary | ICD-10-CM

## 2020-09-24 DIAGNOSIS — J301 Allergic rhinitis due to pollen: Secondary | ICD-10-CM | POA: Diagnosis present

## 2020-09-24 DIAGNOSIS — T1491XA Suicide attempt, initial encounter: Secondary | ICD-10-CM | POA: Diagnosis not present

## 2020-09-24 DIAGNOSIS — K219 Gastro-esophageal reflux disease without esophagitis: Secondary | ICD-10-CM | POA: Diagnosis present

## 2020-09-24 DIAGNOSIS — Z87891 Personal history of nicotine dependence: Secondary | ICD-10-CM

## 2020-09-24 DIAGNOSIS — G4700 Insomnia, unspecified: Secondary | ICD-10-CM | POA: Diagnosis present

## 2020-09-24 DIAGNOSIS — E039 Hypothyroidism, unspecified: Secondary | ICD-10-CM | POA: Diagnosis present

## 2020-09-24 MED ORDER — ALUM & MAG HYDROXIDE-SIMETH 200-200-20 MG/5ML PO SUSP: 30.0000 mL | ORAL | Status: DC | PRN

## 2020-09-24 MED ORDER — TRAZODONE HCL 50 MG PO TABS
50.0000 mg | ORAL_TABLET | Freq: Every evening | ORAL | Status: DC | PRN
  Administered 2020-09-26: 50 mg via ORAL
  Filled 2020-09-24: qty 1

## 2020-09-24 MED ORDER — BUSPIRONE HCL 5 MG PO TABS: 10.0000 mg | ORAL_TABLET | Freq: Three times a day (TID) | ORAL | Status: DC | PRN

## 2020-09-24 MED ORDER — HYDROXYZINE HCL 25 MG PO TABS
25.0000 mg | ORAL_TABLET | Freq: Three times a day (TID) | ORAL | Status: DC | PRN
  Administered 2020-09-26 – 2020-10-01 (×7): 25 mg via ORAL
  Filled 2020-09-24 (×7): qty 1

## 2020-09-24 MED ORDER — PANTOPRAZOLE SODIUM 40 MG PO TBEC
40.0000 mg | DELAYED_RELEASE_TABLET | Freq: Every day | ORAL | Status: DC
  Administered 2020-09-25 – 2020-10-02 (×8): 40 mg via ORAL
  Filled 2020-09-24 (×10): qty 1

## 2020-09-24 MED ORDER — BUSPIRONE HCL 10 MG PO TABS
10.0000 mg | ORAL_TABLET | Freq: Two times a day (BID) | ORAL | Status: DC
  Administered 2020-09-24 – 2020-09-26 (×4): 10 mg via ORAL
  Filled 2020-09-24 (×6): qty 1

## 2020-09-24 MED ORDER — BUPROPION HCL ER (XL) 300 MG PO TB24
300.0000 mg | ORAL_TABLET | Freq: Every day | ORAL | Status: DC
  Administered 2020-09-25 – 2020-10-02 (×8): 300 mg via ORAL
  Filled 2020-09-24 (×9): qty 1

## 2020-09-24 MED ORDER — LORATADINE 10 MG PO TABS
10.0000 mg | ORAL_TABLET | Freq: Every day | ORAL | Status: DC
  Administered 2020-09-24 – 2020-10-02 (×9): 10 mg via ORAL
  Filled 2020-09-24 (×10): qty 1

## 2020-09-24 MED ORDER — MAGNESIUM HYDROXIDE 400 MG/5ML PO SUSP: 30.0000 mL | Freq: Every day | ORAL | Status: DC | PRN

## 2020-09-24 MED ORDER — LEVOTHYROXINE SODIUM 50 MCG PO TABS
50.0000 ug | ORAL_TABLET | Freq: Every day | ORAL | Status: DC
  Administered 2020-09-25 – 2020-10-02 (×8): 50 ug via ORAL
  Filled 2020-09-24 (×9): qty 1

## 2020-09-24 MED ORDER — FLUTICASONE PROPIONATE 50 MCG/ACT NA SUSP
1.0000 | Freq: Every day | NASAL | Status: DC
  Administered 2020-09-24 – 2020-10-02 (×9): 1 via NASAL
  Filled 2020-09-24: qty 16

## 2020-09-24 MED ORDER — ACETAMINOPHEN 325 MG PO TABS
650.0000 mg | ORAL_TABLET | Freq: Four times a day (QID) | ORAL | Status: DC | PRN
  Administered 2020-09-24 (×2): 650 mg via ORAL
  Filled 2020-09-24 (×2): qty 2

## 2020-09-24 NOTE — H&P (Signed)
Psychiatric Admission Assessment Adult  Patient Identification: Hannah [name] MRN:  [id number] Date of Evaluation:  09/24/2020 Chief Complaint:  MDD (major depressive disorder), recurrent severe, without psychosis (HCC) [F33.2] Principal Diagnosis: MDD (major depressive disorder), recurrent severe, without psychosis (HCC) Diagnosis:  Principal Problem:   MDD (major depressive disorder), recurrent severe, without psychosis (HCC) Active Problems:   Anxiety disorder   Suicide attempt [facility]  Patient seen and interviewed.  Medical record reviewed.  Case discussed in detail with members of the treatment team.   History of Present Illness: Hannah [name] is a 55 year old female with a history of depression and anxiety who was taken to a local emergency department by law enforcement after she made a suicide attempt by carbon monoxide poisoning and texted her husband that she was going to commit suicide while in a locked car with the ignition running.  Her husband called 911.  Patient consumed approximately 8 beers, locked herself in the car in the garage and turned on the ignition in a closed garage before texting her husband.  Her BAL was 148 in the emergency department.  On interview today the patient states that she has been really depressed since the beginning of the year when she separated from her husband of 31 years.  She also has multiple other life changes as result of the break-up of her marriage, including moving to a new town, leaving a job she loved, starting a new job, leaving friends that she liked to move back to [place].  The patient reports feeling very stressed by the changes.  She has had depressive symptoms including depressed mood, anxiety, feeling numb, decreased interest, decreased energy, decreased appetite, feelings of guilt and worthlessness and suicidal ideation.  She reports that she has been sleeping okay (6 to 8 hours per night).  The patient states that on Saturday night  she felt more depressed because she was alone for the weekend.  She drank approximately 8 beers and then locked herself in the car in the garage and turned on the ignition.  Patient reports that she texted her husband that she was going to kill herself and he called 911.  Police transported her to the emergency department.   Patient reports feeling better since the day of her attempt following transfer to the behavioral health [facility].  She slept well last night and her mood is better.  She denies any current suicidal ideation intent prep or plan.  She denies passive wish for death.  She denies AI, HI, PI, AH, VH.  The patient states that she was started on Wellbutrin approximately 3 weeks ago and the dose was recently increased 1 week ago from 150 mg daily to 300 mg daily.  She states that she was started on BuSpar 7.5 mg twice a day in November 2021.  She feels that the medications have not had a chance to work.  The patient reports she has also been journaling, exercising regularly and started going to the therapy.  The patient blames her suicide attempt on Saturday night to her alcohol intake that night.  She reports that she almost never drinks alcohol and when she does she typically drinks 1-2 drinks per occasion.  She denies any drug use; however, she notes that she has been vaping more frequently recently to decrease her anxiety.  She reports that she has had problems with depression on and off since the 10th or 11th grade. She denies any history of manic episodes.  She denies any history  of psychotic symptoms.  She denies prior suicide attempts.  She denies prior inpatient psychiatric admissions.  She denies access to firearms.  She gives permission to allow her 53 year old son Jill AlexandersJustin to be contacted.    Associated Signs/Symptoms: Depression Symptoms:  depressed mood, anhedonia, fatigue, feelings of worthlessness/guilt, hopelessness, recurrent thoughts of death, suicidal attempt, anxiety, loss  of energy/fatigue, decreased appetite, Duration of Depression Symptoms: Greater than two weeks  (Hypo) Manic Symptoms:  none Anxiety Symptoms:  Excessive Worry, Psychotic Symptoms:  none PTSD Symptoms: Negative Total Time spent with patient: 30 minutes  Past Psychiatric History: The patient reports prior diagnoses of depression and anxiety.  She reports problems with depression beginning in 10th or 11th grade and occurring off and on during her life.  Denies any history of prior inpatient psychiatric admissions.  She denies any prior suicide attempts.  She denies any history of nonsuicidal self-injurious behavior.  Most recently she has been treated with a combination of Wellbutrin 300 mg daily (started 3 weeks ago and increased from 150 mg daily 1 week ago) and BuSpar 7.5 mg twice daily.  Is the patient at risk to self? Yes.    Has the patient been a risk to self in the past 6 months? Yes.    Has the patient been a risk to self within the distant past? No.  Is the patient a risk to others? No.  Has the patient been a risk to others in the past 6 months? No.  Has the patient been a risk to others within the distant past? No.   Prior Inpatient Therapy:   Prior Outpatient Therapy:    Alcohol Screening:   Substance Abuse History in the last 12 months:  No. Consequences of Substance Abuse: Negative Previous Psychotropic Medications: Yes  Psychological Evaluations: No  Past Medical History:  Past Medical History:  Diagnosis Date  . Allergy   . Anxiety   . Asthma   . Depression   . GERD (gastroesophageal reflux disease)   . Migraine     Past Surgical History:  Procedure Laterality Date  . ABDOMINAL HYSTERECTOMY  2004  . APPENDECTOMY  2004  . CERVICAL CONIZATION W/BX  11/1995  . CESAREAN SECTION      x2  . laposcophy  05/1996  . TONSILLECTOMY    . TUBAL LIGATION    . WISDOM TOOTH EXTRACTION     x 4   Family History:  Family History  Adopted: Yes  Problem Relation Age of  Onset  . Arthritis Mother   . Depression Mother   . Hypertension Mother   . Heart disease Mother        CHF  . Kidney disease Mother   . Diabetes Father   . Depression Sister   . Cancer Brother        colon, lungs, liver  . Diabetes Paternal Grandmother    Family Psychiatric  History: Patient reports that her mother had depression and was admitted to inpatient psychiatric hospital.  Her half sister (same mother) has bipolar disorder and drug use.  She has a half brother (same mother) with alcohol use.  She denies any family history of suicide. Tobacco Screening:   Social History:  Social History   Substance and Sexual Activity  Alcohol Use Yes   Comment: occasion "rare"     Social History   Substance and Sexual Activity  Drug Use No    Additional Social History:  Allergies:   Allergies  Allergen Reactions  . Cough-Cold Medicine  [Phenir-Pe-Sod Sal-Caff Cit] Itching  . Elemental Sulfur   . Erythromycin   . Levofloxacin     headache, chest discomfort, insomnia  . Sulfonamide Derivatives   . Tetracyclines & Related   . Tramadol     Lips and mouth numbness.   Lab Results:  Results for orders placed or performed during the hospital encounter of 09/22/20 (from the past 48 hour(s))  Comprehensive metabolic panel     Status: Abnormal   Collection Time: 09/22/20 11:51 PM  Result Value Ref Range   Sodium 136 135 - 145 mmol/L   Potassium 3.8 3.5 - 5.1 mmol/L   Chloride 105 98 - 111 mmol/L   CO2 20 (L) 22 - 32 mmol/L   Glucose, Bld 126 (H) 70 - 99 mg/dL    Comment: Glucose reference range applies only to samples taken after fasting for at least 8 hours.   BUN 9 6 - 20 mg/dL   Creatinine, Ser 4.85 0.44 - 1.00 mg/dL   Calcium 9.6 8.9 - 46.2 mg/dL   Total Protein 8.0 6.5 - 8.1 g/dL   Albumin 4.6 3.5 - 5.0 g/dL   AST 29 15 - 41 U/L   ALT 27 0 - 44 U/L   Alkaline Phosphatase 76 38 - 126 U/L   Total Bilirubin 1.0 0.3 - 1.2 mg/dL   GFR,  Estimated >70 >35 mL/min    Comment: (NOTE) Calculated using the CKD-EPI Creatinine Equation (2021)    Anion gap 11 5 - 15    Comment: Performed at [facility], [address] [place], [place] [address]  Ethanol     Status: Abnormal   Collection Time: 09/22/20 11:51 PM  Result Value Ref Range   Alcohol, Ethyl (B) 148 (H) <10 mg/dL    Comment: (NOTE) Lowest detectable limit for serum alcohol is 10 mg/dL.  For medical purposes only. Performed at [facility], [address] [place], [place] [address]   Salicylate level     Status: Abnormal   Collection Time: 09/22/20 11:51 PM  Result Value Ref Range   Salicylate Lvl <7.0 (L) 7.0 - 30.0 mg/dL    Comment: Performed at [facility], [address] [place], [place] [address]  Acetaminophen level     Status: Abnormal   Collection Time: 09/22/20 11:51 PM  Result Value Ref Range   Acetaminophen (Tylenol), Serum <10 (L) 10 - 30 ug/mL    Comment: (NOTE) Therapeutic concentrations vary significantly. A range of 10-30 ug/mL  may be an effective concentration for many patients. However, some  are best treated at concentrations outside of this range. Acetaminophen concentrations >150 ug/mL at 4 hours after ingestion  and >50 ug/mL at 12 hours after ingestion are often associated with  toxic reactions.  Performed at [facility], [address] [place], [place] [address]   cbc     Status: None   Collection Time: 09/22/20 11:51 PM  Result Value Ref Range   WBC 6.5 4.0 - 10.5 K/uL   RBC 5.00 3.87 - 5.11 MIL/uL   Hemoglobin 14.2 12.0 - 15.0 g/dL   HCT 81.0 17.5 - 10.2 %   MCV 83.0 80.0 - 100.0 fL   MCH 28.4 26.0 - 34.0 pg   MCHC 34.2 30.0 - 36.0 g/dL   RDW 58.5 27.7 - 82.4 %   Platelets 286 150 - 400 K/uL   nRBC 0.0 0.0 - 0.2 %  Comment: Performed at [facility], [address] [place], [place] [address]  Urine Drug Screen, Qualitative     Status: None   Collection Time:  09/22/20 11:51 PM  Result Value Ref Range   Tricyclic, Ur Screen NONE DETECTED NONE DETECTED   Amphetamines, Ur Screen NONE DETECTED NONE DETECTED   MDMA (Ecstasy)Ur Screen NONE DETECTED NONE DETECTED   Cocaine Metabolite,Ur [place] NONE DETECTED NONE DETECTED   Opiate, Ur Screen NONE DETECTED NONE DETECTED   Phencyclidine (PCP) Ur S NONE DETECTED NONE DETECTED   Cannabinoid 50 Ng, Ur [place] NONE DETECTED NONE DETECTED   Barbiturates, Ur Screen NONE DETECTED NONE DETECTED   Benzodiazepine, Ur Scrn NONE DETECTED NONE DETECTED   Methadone Scn, Ur NONE DETECTED NONE DETECTED    Comment: (NOTE) Tricyclics + metabolites, urine    Cutoff 1000 ng/mL Amphetamines + metabolites, urine  Cutoff 1000 ng/mL MDMA (Ecstasy), urine              Cutoff 500 ng/mL Cocaine Metabolite, urine          Cutoff 300 ng/mL Opiate + metabolites, urine        Cutoff 300 ng/mL Phencyclidine (PCP), urine         Cutoff 25 ng/mL Cannabinoid, urine                 Cutoff 50 ng/mL Barbiturates + metabolites, urine  Cutoff 200 ng/mL Benzodiazepine, urine              Cutoff 200 ng/mL Methadone, urine                   Cutoff 300 ng/mL  The urine drug screen provides only a preliminary, unconfirmed analytical test result and should not be used for non-medical purposes. Clinical consideration and professional judgment should be applied to any positive drug screen result due to possible interfering substances. A more specific alternate chemical method must be used in order to obtain a confirmed analytical result. Gas chromatography / mass spectrometry (GC/MS) is the preferred confirm atory method. Performed at [facility], [address] [place], [place] [address]   Resp Panel by RT-PCR (Flu A&B, Covid) Nasopharyngeal Swab     Status: None   Collection Time: 09/23/20  3:15 AM   Specimen: Nasopharyngeal Swab; Nasopharyngeal(NP) swabs in vial transport medium  Result Value Ref Range   SARS Coronavirus 2 by RT PCR  NEGATIVE NEGATIVE    Comment: (NOTE) SARS-CoV-2 target nucleic acids are NOT DETECTED.  The SARS-CoV-2 RNA is generally detectable in upper respiratory specimens during the acute phase of infection. The lowest concentration of SARS-CoV-2 viral copies this assay can detect is 138 copies/mL. A negative result does not preclude SARS-Cov-2 infection and should not be used as the sole basis for treatment or other patient management decisions. A negative result may occur with  improper specimen collection/handling, submission of specimen other than nasopharyngeal swab, presence of viral mutation(s) within the areas targeted by this assay, and inadequate number of viral copies(<138 copies/mL). A negative result must be combined with clinical observations, patient history, and epidemiological information. The expected result is Negative.  Fact Sheet for Patients:  BloggerCourse.com  Fact Sheet for Healthcare Providers:  SeriousBroker.it  This test is no t yet approved or cleared by the [place] FDA and  has been authorized for detection and/or diagnosis of SARS-CoV-2 by FDA under an Emergency Use Authorization (EUA). This EUA will remain  in effect (meaning this test can be  used) for the duration of the COVID-19 declaration under Section 564(b)(1) of the Act, 21 U.S.C.section 360bbb-3(b)(1), unless the authorization is terminated  or revoked sooner.       Influenza A by PCR NEGATIVE NEGATIVE   Influenza B by PCR NEGATIVE NEGATIVE    Comment: (NOTE) The Xpert Xpress SARS-CoV-2/FLU/RSV plus assay is intended as an aid in the diagnosis of influenza from Nasopharyngeal swab specimens and should not be used as a sole basis for treatment. Nasal washings and aspirates are unacceptable for Xpert Xpress SARS-CoV-2BloggerCourse.coments: https://www.fda.gov/media/152166/download  Fact Sheet for Healthcare  Providers: SeriousBroker.it  This test is not yet approved or cleared by the [place] FDA and has been authorized for detection and/or diagnosis of SARS-CoV-2 by FDA under an Emergency Use Authorization (EUA). This EUA will remain in effect (meaning this test can be used) for the duration of the COVID-19 declaration under Section 564(b)(1) of the Act, 21 U.S.C. section 360bbb-3(b)(1), unless the authorization is terminated or revoked.  Performed at [facility], [address] [place], [place] [address]     Blood Alcohol level:  Lab Results  Component Value Date   ETH 148 (H) 09/22/2020    Metabolic Disorder Labs:  Lab Results  Component Value Date   HGBA1C 5.0 10/31/2016   No results found for: PROLACTIN Lab Results  Component Value Date   CHOL 247 (H) 10/31/2016   TRIG 122 10/31/2016   HDL 66 10/31/2016   CHOLHDL 3.7 10/31/2016   LDLCALC 157 (H) 10/31/2016   LDLCALC 117 05/16/2014    Current Medications: Current Facility-Administered Medications  Medication Dose Route Frequency Provider Last Rate Last Admin  . acetaminophen (TYLENOL) tablet 650 mg  650 mg Oral Q6H PRN Laveda Abbe, NP   650 mg at 09/24/20 1434  . alum & mag hydroxide-simeth (MAALOX/MYLANTA) 200-200-20 MG/5ML suspension 30 mL  30 mL Oral Q4H PRN Laveda Abbe, NP      . Melene Muller ON 09/25/2020] buPROPion (WELLBUTRIN XL) 24 hr tablet 300 mg  300 mg Oral Daily Laveda Abbe, NP      . busPIRone (BUSPAR) tablet 10 mg  10 mg Oral BID Claudie Revering, MD      . fluticasone [facility]) 50 MCG/ACT nasal spray 1 spray  1 spray Each Nare Daily Laveda Abbe, NP   1 spray at 09/24/20 1434  . hydrOXYzine (ATARAX/VISTARIL) tablet 25 mg  25 mg Oral TID PRN Laveda Abbe, NP      . Melene Muller ON 09/25/2020] levothyroxine (SYNTHROID) tablet 50 mcg  50 mcg Oral Q0600 Laveda Abbe, NP      . loratadine (CLARITIN) tablet 10 mg  10 mg Oral  Daily Laveda Abbe, NP   10 mg at 09/24/20 1434  . magnesium hydroxide (MILK OF MAGNESIA) suspension 30 mL  30 mL Oral Daily PRN Laveda Abbe, NP      . pantoprazole (PROTONIX) EC tablet 40 mg  40 mg Oral Daily Claudie Revering, MD      . traZODone (DESYREL) tablet 50 mg  50 mg Oral QHS PRN Laveda Abbe, NP       PTA Medications: Medications Prior to Admission  Medication Sig Dispense Refill Last Dose  . buPROPion (WELLBUTRIN XL) 150 MG 24 hr tablet Take 150 mg by mouth daily.     . busPIRone (BUSPAR) 10 MG tablet Take 10 mg by mouth 3 (three) times daily as needed.     Marland Kitchen  cetirizine (ZYRTEC) 10 MG tablet Take 10 mg by mouth daily.     . fluticasone (FLONASE) 50 MCG/ACT nasal spray Place into both nostrils daily.     Marland Kitchen levothyroxine (SYNTHROID) 50 MCG tablet Take 50 mcg by mouth daily before breakfast.     . omeprazole (PRILOSEC) 40 MG capsule Take 40 mg by mouth daily.       Musculoskeletal: Strength & Muscle Tone: within normal limits Gait & Station: normal Patient leans: N/A            Psychiatric Specialty Exam:  Presentation  General Appearance: Appropriate for Environment; Casual  Eye Contact:Good  Speech:Clear and Coherent; Normal Rate  Speech Volume:Normal  Handedness:Right   Mood and Affect  Mood:Depressed; Anxious  Affect:Constricted   Thought Process  Thought Processes:Coherent; Goal Directed; Linear  Duration of Psychotic Symptoms: No data recorded Past Diagnosis of Schizophrenia or Psychoactive disorder: No  Descriptions of Associations:Intact  Orientation:Full (Time, Place and Person)  Thought Content:Logical  Hallucinations:Hallucinations: None  Ideas of Reference:None  Suicidal Thoughts:Suicidal Thoughts: No SI Active Intent and/or Plan: With Plan; With Means to Carry Out; With Intent  Homicidal Thoughts:Homicidal Thoughts: No   Sensorium  Memory:Immediate Good; Recent Good; Remote  Good  Judgment:Good  Insight:Good   Executive Functions  Concentration:Good  Attention Span:Good  Recall:Good  Fund of Knowledge:Good  Language:Good   Psychomotor Activity  Psychomotor Activity:Psychomotor Activity: Normal   Assets  Assets:Communication Skills; Desire for Improvement; Housing; Health and safety inspector; Physical Health; Leisure Time; Social Support; Resilience; Vocational/Educational   Sleep  Sleep:Sleep: Good    Physical Exam: Physical Exam Vitals and nursing note reviewed.  Constitutional:      Appearance: Normal appearance.  HENT:     Head: Normocephalic and atraumatic.  Pulmonary:     Effort: Pulmonary effort is normal.  Neurological:     General: No focal deficit present.     Mental Status: She is alert and oriented to person, place, and time.    ROS Blood pressure 122/61, pulse 82, temperature 98.2 F (36.8 C), temperature source Oral, resp. rate 16, height 4\' 11"  (1.499 m), weight 71.7 kg, SpO2 100 %. Body mass index is 31.13 kg/m.  55 year old female with major depressive disorder recurrent admitted following a suicide attempt in the context of alcohol use and multiple recent psychosocial stressors.  The patient reports improved mood today and attributes her suicide attempt precipitating the current admission to her excessive alcohol intake on Saturday.  She continues with some residual mood and anxiety symptoms.  Inpatient hospitalization is required for safety of patient and continued treatment and stabilization of mood and anxiety symptoms.  Treatment Plan Summary: Daily contact with patient to assess and evaluate symptoms and progress in treatment and Medication management  Observation Level/Precautions:  15 minute checks  Laboratory:  CBC Chemistry Profile HbAIC UDS Lipid panel, TSH.  Available lab results reviewed.  CMP with CO2 of 20 and glucose of 126 but otherwise within normal limits.  CBC within normal limits.   Psychotherapy: Encourage participation in group therapy and therapeutic milieu  Medications: Continue Wellbutrin XL 300 mg daily for depression.  Increase BuSpar to 10 mg twice daily for anxiety and adjunct of treatment of mood.  Trazodone 50 mg at bedtime as needed for insomnia.  Hydroxyzine 25 mg 3 times daily as needed anxiety.  We will continue Synthroid 50 mcg daily for hypothyroidism, Claritin 10 mg daily for allergies, Flonase spray for allergies and Protonix daily for GERD.  EKG was reviewed  Consultations:  Discharge Concerns:    Estimated LOS: 3 to 5 days  Other:     Physician Treatment Plan for Primary Diagnosis: MDD (major depressive disorder), recurrent severe, without psychosis (HCC) Long Term Goal(s): Improvement in symptoms so as ready for discharge  Short Term Goals: Ability to identify changes in lifestyle to reduce recurrence of condition will improve, Ability to verbalize feelings will improve, Ability to disclose and discuss suicidal ideas, Ability to identify and develop effective coping behaviors will improve, Ability to maintain clinical measurements within normal limits will improve and Ability to identify triggers associated with substance abuse/mental health issues will improve  Physician Treatment Plan for Secondary Diagnosis: Principal Problem:   MDD (major depressive disorder), recurrent severe, without psychosis (HCC) Active Problems:   Anxiety disorder   Suicide attempt (HCC)  Long Term Goal(s): Improvement in symptoms so as ready for discharge  Short Term Goals: Ability to identify changes in lifestyle to reduce recurrence of condition will improve, Ability to verbalize feelings will improve, Ability to disclose and discuss suicidal ideas, Ability to identify and develop effective coping behaviors will improve, Ability to maintain clinical measurements within normal limits will improve and Ability to identify triggers associated with substance abuse/mental  health issues will improve  I certify that inpatient services furnished can reasonably be expected to improve the patient's condition.    Claudie Revering, MD 3/28/20222:37 PM

## 2020-09-24 NOTE — ED Notes (Signed)
Hourly rounding reveals patient in room. No complaints, stable, in no acute distress. Q15 minute rounds and monitoring via Security Cameras to continue. 

## 2020-09-24 NOTE — Progress Notes (Signed)
   09/24/20 1100  Vital Signs  Temp 98.2 F (36.8 C)  Temp Source Oral  Pulse Rate 82  Pulse Rate Source Dinamap  Resp 16  BP 122/61  BP Location Left Arm  BP Method Automatic  Patient Position (if appropriate) Sitting  Oxygen Therapy  SpO2 100 %  O2 Device Room Air  Pain Assessment  Pain Scale 0-10  Pain Score 7  Pain Location Head  Pain Intervention(s) RN made aware  Height and Weight  Height 4\' 11"  (1.499 m)  Weight 71.7 kg  Type of Weight Actual  BSA (Calculated - sq m) 1.73 sq meters  BMI (Calculated) 31.89  Weight in (lb) to have BMI = 25 123.5   Initial Nursing Assessment:  D: Patient is a 55 y.o. Caucasian female IVC'd from [facility] ED for an SUA by carbon monoxide poisoning. Pt. Reported that her husband of 31 years left her. Pt. Reported past dx of anxiety, depression,and asthma. Pt. Has had a full hysterectomy in 2004. Pt. Complains of agitation, anxiety decrease in appetite, crying spells, depression, hopelessness, irritability, loneliness, sadness, and self-harm. Pt. Has multiple self inflicted bruises on chest arms and bilateral thighs. Pt. Currently denies SI/HI/AVH.  A: Support and encouragement provided Routine safety checks conducted every 15 minutes. Patient  Informed to notify staff with any concerns.  R:  Pt contracts for safety.  Safety maintained.

## 2020-09-24 NOTE — ED Notes (Signed)
Pt given breakfast tray

## 2020-09-24 NOTE — ED Notes (Signed)
VS not taken patient asleep 

## 2020-09-24 NOTE — Progress Notes (Addendum)
   09/24/20 2030  Psych Admission Type (Psych Patients Only)  Admission Status Involuntary  Psychosocial Assessment  Patient Complaints None  Eye Contact Fair  Facial Expression Anxious  Affect Anxious;Depressed  Speech Logical/coherent  Interaction Assertive  Motor Activity Other (Comment) (wnl)  Appearance/Hygiene Unremarkable  Behavior Characteristics Cooperative  Mood Depressed;Anxious  Thought Process  Coherency WDL  Content WDL  Delusions None reported or observed  Perception WDL  Hallucination None reported or observed  Judgment Poor  Confusion None  Danger to Self  Current suicidal ideation? Denies  Danger to Others  Danger to Others None reported or observed   Pt denies SI, HI, AVH. Says she has a headache rated 6/10. Denies anxiety and depression. Agreed to Tylenol for her pain.

## 2020-09-24 NOTE — ED Notes (Signed)
Patient taking a shower.

## 2020-09-24 NOTE — ED Notes (Signed)
This Clinical research associate informed patient that she will be going to [facility] instead of [facility]. Patient was provided with facility address and phone number as requested. No issues.

## 2020-09-24 NOTE — ED Notes (Signed)
IVC/pending transport to [facility] after 9AM. 

## 2020-09-24 NOTE — BHH Suicide Risk Assessment (Signed)
[facility] Admission Suicide Risk Assessment   Nursing information obtained from:  Patient Demographic factors:  Caucasian,Divorced or widowed Current Mental Status:  NA Loss Factors:  Loss of significant relationship Historical Factors:  Impulsivity Risk Reduction Factors:  Sense of responsibility to family,Employed,Living with another person, especially a relative,Positive social support  Total Time spent with patient: 30 minutes Principal Problem: MDD (major depressive disorder), recurrent severe, without psychosis (HCC) Diagnosis:  Principal Problem:   MDD (major depressive disorder), recurrent severe, without psychosis (HCC) Active Problems:   Anxiety disorder   Suicide attempt (HCC)  Subjective Data: See H&P  Continued Clinical Symptoms:  Alcohol Use Disorder Identification Test Final Score (AUDIT): 1 The "Alcohol Use Disorders Identification Test", Guidelines for Use in Primary Care, Second Edition.  World Science writer [facility]). Score between 0-7:  no or low risk or alcohol related problems. Score between 8-15:  moderate risk of alcohol related problems. Score between 16-19:  high risk of alcohol related problems. Score 20 or above:  warrants further diagnostic evaluation for alcohol dependence and treatment.   CLINICAL FACTORS:   Depression:   Anhedonia Impulsivity Previous Psychiatric Diagnoses and Treatments   Musculoskeletal: Strength & Muscle Tone: within normal limits Gait & Station: normal Patient leans: N/A  Psychiatric Specialty Exam:  Presentation  General Appearance: Appropriate for Environment; Casual  Eye Contact:Good  Speech:Clear and Coherent; Normal Rate  Speech Volume:Normal  Handedness:Right   Mood and Affect  Mood:Depressed; Anxious  Affect:Constricted   Thought Process  Thought Processes:Coherent; Goal Directed; Linear  Descriptions of Associations:Intact  Orientation:Full (Time, Place and Person)  Thought  Content:Logical  History of Schizophrenia/Schizoaffective disorder:No  Duration of Psychotic Symptoms:No data recorded Hallucinations:Hallucinations: None  Ideas of Reference:None  Suicidal Thoughts:Suicidal Thoughts: No SI Active Intent and/or Plan: With Plan; With Means to Carry Out; With Intent  Homicidal Thoughts:Homicidal Thoughts: No   Sensorium  Memory:Immediate Good; Recent Good; Remote Good  Judgment:Good  Insight:Good   Executive Functions  Concentration:Good  Attention Span:Good  Recall:Good  Fund of Knowledge:Good  Language:Good   Psychomotor Activity  Psychomotor Activity:Psychomotor Activity: Normal   Assets  Assets:Communication Skills; Desire for Improvement; Housing; Health and safety inspector; Physical Health; Leisure Time; Social Support; Resilience; Vocational/Educational   Sleep  Sleep:Sleep: Good    Physical Exam: Physical Exam Vitals and nursing note reviewed.  HENT:     Head: Normocephalic and atraumatic.  Pulmonary:     Effort: Pulmonary effort is normal.  Neurological:     General: No focal deficit present.     Mental Status: She is oriented to person, place, and time.    Review of Systems  Constitutional: Negative for chills, diaphoresis and fever.  HENT: Negative for sore throat.   Eyes: Negative for blurred vision.  Respiratory: Negative for cough and shortness of breath.   Cardiovascular: Negative for chest pain and palpitations.  Gastrointestinal: Negative for constipation, diarrhea, nausea and vomiting.  Genitourinary: Negative for dysuria.  Musculoskeletal: Negative for joint pain and myalgias.  Skin: Negative for rash.  Neurological: Negative for dizziness, tremors and headaches.  Psychiatric/Behavioral: Positive for depression. Negative for hallucinations. The patient is nervous/anxious.    Blood pressure 122/61, pulse 82, temperature 98.2 F (36.8 C), temperature source Oral, resp. rate 16, height 4\' 11"   (1.499 m), weight 71.7 kg, SpO2 100 %. Body mass index is 31.91 kg/m.   COGNITIVE FEATURES THAT CONTRIBUTE TO RISK:  None    SUICIDE RISK:   Moderate:  Frequent suicidal ideation with limited intensity, and duration, some specificity in  terms of plans, no associated intent, good self-control, limited dysphoria/symptomatology, some risk factors present, and identifiable protective factors, including available and accessible social support.  PLAN OF CARE: See H&P  I certify that inpatient services furnished can reasonably be expected to improve the patient's condition.   Claudie Revering, MD 09/24/2020, 3:39 PM

## 2020-09-24 NOTE — Tx Team (Signed)
Initial Treatment Plan 09/24/2020 12:25 PM RUFINA KIMERY QQI:[id number]    PATIENT STRESSORS: Loss of marriage Marital or family conflict   PATIENT STRENGTHS: Ability for insight Average or above average intelligence Capable of independent living Metallurgist fund of knowledge Physical Health Supportive family/friends   PATIENT IDENTIFIED PROBLEMS: Anxiety   depression  Loss of marriage                 DISCHARGE CRITERIA:  Ability to meet basic life and health needs Adequate post-discharge living arrangements Improved stabilization in mood, thinking, and/or behavior  PRELIMINARY DISCHARGE PLAN: Outpatient therapy Return to previous work or school arrangements  PATIENT/FAMILY INVOLVEMENT: This treatment plan has been presented to and reviewed with the patient, DESIRRE EICKHOFF.  The patient has been given the opportunity to ask questions and make suggestions.  Wardell Heath, RN 09/24/2020, 12:25 PM

## 2020-09-24 NOTE — Progress Notes (Signed)
The patient rated her day as an 8 out of 10 since she had a better day today. She explained in group that she felt better today since she is feeling "more optimistic".  Her goal for tomorrow is to attend more of the groups.

## 2020-09-24 NOTE — ED Notes (Signed)
TTS called and informed this writer that the patient will be going to [facility] instead of [facility]. Charge nurse notified.

## 2020-09-24 NOTE — BH Assessment (Signed)
This Clinical research associate called [facility] and spoke with [place]. Writer explained patient has been accepted to another facility.

## 2020-09-24 NOTE — BH Assessment (Signed)
TTS received call from [facility], [facility] Binnie Rail) stating patient has been accepted.   Patient has been accepted to [facility] after 8:00am on Monday 09/24/20.  Patient assigned to room 401, bed 1 Accepting physician is Dr. Jola Babinski.  Call report to [phone].  Representative was Binnie Rail, [facility].   ER Staff is aware of it:  Melody, ER Secretary  Dr. York Cerise, ER MD  Lacinda Axon, Patient's Nurse

## 2020-09-24 NOTE — BHH Group Notes (Signed)
Occupational Therapy Group Note Date: 09/24/2020 Group Topic/Focus: Stress Management  Group Description: Group encouraged increased participation and engagement through discussion focused on topic of stress management. Patients engaged interactively to discuss components of stress including physical signs, emotional signs, negative management strategies, and positive management strategies. Each individual identified one new stress management strategy they would like to try moving forward.    Therapeutic Goals: Identify current stressors Identify healthy vs unhealthy stress management strategies/techniques Discuss and identify physical and emotional signs of stress Participation Level: Moderate   Participation Quality: Minimal Cues   Behavior: Calm and Cooperative   Speech/Thought Process: Directed   Affect/Mood: Anxious   Insight: Fair   Judgement: Fair   Individualization: Hannah [name] was moderately engaged in their participation of group discussion/activity. Pt denied having any current stressors, stating "It's more about my depression than stress I would say." Pt was attentive to discussion and contributed appropriately, however was pulled after about 20 minutes to meet with MD. Pt did not return to group.   Modes of Intervention: Activity, Discussion and Education  Patient Response to Interventions:  Attentive and Engaged   Plan: Continue to engage patient in OT groups 2 - 3x/week.  09/24/2020  Donne Hazel, MOT, OTR/L

## 2020-09-25 LAB — LIPID PANEL
Cholesterol: 196 mg/dL (ref 0–200)
HDL: 47 mg/dL (ref >40)
LDL Cholesterol: 111 mg/dL — ABNORMAL HIGH (ref 0–99)
Total CHOL/HDL Ratio: 4.2 RATIO
Triglycerides: 189 mg/dL — ABNORMAL HIGH (ref <150)
VLDL: 38 mg/dL (ref 0–40)

## 2020-09-25 LAB — HEMOGLOBIN A1C
Hgb A1c MFr Bld: 5.3 % (ref 4.8–5.6)
Mean Plasma Glucose: 105.41 mg/dL

## 2020-09-25 LAB — TSH: TSH: 4.46 u[IU]/mL (ref 0.350–4.500)

## 2020-09-25 NOTE — BHH Suicide Risk Assessment (Signed)
BHH INPATIENT:  Family/Significant Other Suicide Prevention Education  Suicide Prevention Education:  Education Completed; Abriana Saltos (son) [phone],  (name of family member/significant other) has been identified by the patient as the family member/significant other with whom the patient will be residing, and identified as the person(s) who will aid the patient in the event of a mental health crisis (suicidal ideations/suicide attempt).  With written consent from the patient, the family member/significant other has been provided the following suicide prevention education, prior to the and/or following the discharge of the patient.  The suicide prevention education provided includes the following:  Suicide risk factors  Suicide prevention and interventions  National Suicide Hotline telephone number  [facility] assessment telephone number  [facility] Emergency Assistance 911  [facility] and/or Residential Mobile Crisis Unit telephone number  Request made of family/significant other to:  Remove weapons (e.g., guns, rifles, knives), all items previously/currently identified as safety concern.    Remove drugs/medications (over-the-counter, prescriptions, illicit drugs), all items previously/currently identified as a safety concern.  The family member/significant other verbalizes understanding of the suicide prevention education information provided.  The family member/significant other agrees to remove the items of safety concern listed above.  Mr. Fahringer states that the firearm is currently in the home but that his mother does not know where it is and that he only knows where the ammo is. CSW advised Mr. Pizzi to remove firearm from the home or secure it properly before pt's discharge.   When asked what brought the pt to the hospital, Mr. Frogge stated, "My dad decided to separate with her in early Jan. And my mom moved in with me and my girlfriend. She  didn't have a lot of emotion when she first moved in but then came the depression and anger. One day, I heard noises in the kitchen so I went and asked her what she was going and she said she was hitting herself and she began punching herself in the chest in front of me. She has been talking about being dead all the time and her suicidal thoughts. They have increased in frequency and morbidity in the last week. There was 2 days last week where she didn't even get out of bed and even when she got home from work she would go straight to bed. She has been doing virtual therapy once a week and she has been telling her therapist that she has been hurting herself with a hammer because she didn't want me to hear the noises. Her therapist told her that she should tell me about this and that my sister and I should lie to her about when we are with our dad so she did. I removed all of the hammers from the house when I found out and she said, "Do you really think I won't find something else.". I asked her if she needed to go get some help for how she is feeling and she has always said no that she would never act on it. She is currently taking Wellbutrin, that's new and buspar. When I talked to her the other day she said she is just going to say what she needs to to get out of there. She isn't taking it seriously and she isn't ready. She just had a suicide attempt and she doesn't need to leave there this week. I called her during her suicide attempt Saturday, when she was in the car, and she still wouldn't get out of the car. I  was begging her and she still wouldn't. I asked her Saturday when she was at that hospital in [place] if she would do it again and she said "I can't say.".   Mr. Meter states he feels that it is best that his mother does not return home with him at discharge. He states that he will be going back into the office on Monday and that pt will be home alone during the day as the believes she may no longer  has a job and does not feel that it is safe for her to be alone at this time. He states that he has been talking with his father and they would like pt to stay with her sister in [place] upon d/c. Mr. Borin shared that he does not mind sharing information but does not want to be involved in the pt's aftercare planning at this time. CSW shared with Mr. Tristan that pt would have to provide consent for CSW to speak with pt's sister and that pt must agree to go to [facility] upon d/c due to being her own guardain. Mr. Agostini stated that he understood and asked that this information not be shared with the pt at this time. CSW agreed but advised Mr. Mulcahey for him and his family to share with pt sooner rather than later that she can not return home. Mr. Tisdell stated he understood.   Felizardo Hoffmann 09/25/2020, 2:48 PM

## 2020-09-25 NOTE — Progress Notes (Signed)
D: Patient presents with pleasant affect at time of assessment. Patient spent some time in the dayroom interacting with other patients. Patient denies SI/HI at this time. Patient also denies AH/VH at this time. Patient contracts for safety.  A: Provided positive reinforcement and encouragement.  R: Patient cooperative and receptive to efforts. Patient remains safe on the unit.   09/25/20 2102  Psych Admission Type (Psych Patients Only)  Admission Status Involuntary  Psychosocial Assessment  Patient Complaints None  Eye Contact Brief  Facial Expression Flat  Affect Appropriate to circumstance  Speech Logical/coherent  Interaction Assertive;Guarded  Motor Activity Other (Comment) (WDL)  Appearance/Hygiene Unremarkable  Behavior Characteristics Cooperative;Appropriate to situation  Mood Pleasant  Thought Process  Coherency WDL  Content WDL  Delusions None reported or observed  Perception WDL  Hallucination None reported or observed  Judgment WDL  Confusion None  Danger to Self  Current suicidal ideation? Denies  Danger to Others  Danger to Others None reported or observed
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
## 2020-09-25 NOTE — Progress Notes (Signed)
Recreation Therapy Notes  Animal-Assisted Activity (AAA) Program Checklist/Progress Notes Patient Eligibility Criteria Checklist & Daily Group note for Rec Tx Intervention  Date: 3.29.22 Time: 1430 Location: [address]   AAA/T Program Assumption of Risk Form signed by Engineer, production or Parent Legal Guardian YES   Patient is free of allergies or severe asthma  YES   Patient reports no fear of animals YES   Patient reports no history of cruelty to animals  YES   Patient understands his/her participation is voluntary YES   Patient washes hands before animal contact YES   Patient washes hands after animal contact  YES   Behavioral Response: Engaged  Education: Charity fundraiser, Appropriate Animal Interaction   Education Outcome: Acknowledges understanding/In group clarification offered/Needs additional education.   Clinical Observations/Feedback: Pt attended and participated in activity.    Caroll Rancher, LRT/CTRS    Caroll Rancher A 09/25/2020 3:11 PM

## 2020-09-25 NOTE — Progress Notes (Signed)
[facility]  09/25/2020 4:13 PM Hannah [name]  MRN:  [id number]   Hannah [name] is a 55 year old female with a history of depression and anxiety who was taken to a local emergency department by law enforcement after she made a suicide attempt by carbon monoxide poisoning and texted her husband that she was going to commit suicide while in a locked car with the ignition running.   Subjective: Patient seen and interviewed.  Medical record reviewed.  Case discussed in detail with members of the treatment team.  On interview today the patient reports "I feel pretty good."  The patient denies suicidal ideation intent preparation or plan.  She denies passive wishes not to be alive.  She denies AI, HI, AH, VH, PI.  She reports that her mood is better.  She slept well.  She is eating well.  The patient states her goals for admission are to acquire better coping skills when she feels upset and overwhelmed.  She discloses today that she has been punching herself as a release when she gets upset.  She did this as a teenager but has not done it since that time until approximately 1 month ago when she started doing it again.  The patient denies any physical problems she denies any side effects from her medications.    Objective: On interview today the patient presents as superficial and minimizing of her symptoms of depression.   I advised her that she should seek outpatient follow-up with a psychiatrist and with a therapist that she can meet with locally and that the inpatient team could assist her in arranging for this follow-up.  The patient stated that she would like to continue with her primary care doctor prescribing her psychiatric medications and seeing her current online therapist. I also advised the patient to work on a written list of things she can do to alleviate stress and shift her mood when she feels upset.  We also discussed structuring time her time so that she does not feel as overwhelmed  when she is alone.  The patient was receptive to working on these lists.  I again discussed with patient to not drink alcohol after discharge and advised her that it is more likely to worsen her depression and any suicidal thoughts she may have.  I also again discussed with patient that vaping may worsen her anxiety.  The patient stated willingness and desire not to vape and not to use alcohol after discharge.  Social worker spoke with patient's son by phone today with patient's permission.  Reportedly the patient has been expressing suicidal thoughts to some in the month prior to admission.  Principal Problem: MDD (major depressive disorder), recurrent severe, without psychosis (HCC) Diagnosis: Principal Problem:   MDD (major depressive disorder), recurrent severe, without psychosis (HCC) Active Problems:   Anxiety disorder   Suicide attempt (HCC)  Total Time spent with patient: 20 minutes  Past Psychiatric History: See H&P  Past Medical History:  Past Medical History:  Diagnosis Date  . Allergy   . Anxiety   . Asthma   . Depression   . GERD (gastroesophageal reflux disease)   . Migraine     Past Surgical History:  Procedure Laterality Date  . ABDOMINAL HYSTERECTOMY  2004  . APPENDECTOMY  2004  . CERVICAL CONIZATION W/BX  11/1995  . CESAREAN SECTION      x2  . laposcophy  05/1996  . TONSILLECTOMY    . TUBAL LIGATION    .  WISDOM TOOTH EXTRACTION     x 4   Family History:  Family History  Adopted: Yes  Problem Relation Age of Onset  . Arthritis Mother   . Depression Mother   . Hypertension Mother   . Heart disease Mother        CHF  . Kidney disease Mother   . Diabetes Father   . Depression Sister   . Cancer Brother        colon, lungs, liver  . Diabetes Paternal Grandmother    Family Psychiatric  History: See H&P Social History:  Social History   Substance and Sexual Activity  Alcohol Use Yes   Comment: occasion "rare"     Social History   Substance and  Sexual Activity  Drug Use No    Social History   Socioeconomic History  . Marital status: Married    Spouse name: Not on file  . Number of children: Not on file  . Years of education: Not on file  . Highest education level: Not on file  Occupational History  . Not on file  Tobacco Use  . Smoking status: Former Smoker    Packs/day: 1.00    Years: 9.00    Pack years: 9.00    Types: Cigarettes    Quit date: 12/28/1990    Years since quitting: 29.7  . Smokeless tobacco: Never Used  . Tobacco comment: quit 1992  Vaping Use  . Vaping Use: Every day  Substance and Sexual Activity  . Alcohol use: Yes    Comment: occasion "rare"  . Drug use: No  . Sexual activity: Not Currently    Partners: Male    Birth control/protection: Surgical  Other Topics Concern  . Not on file  Social History Narrative  . Not on file   Social Determinants of Health   Financial Resource Strain: Not on file  Food Insecurity: Not on file  Transportation Needs: Not on file  Physical Activity: Not on file  Stress: Not on file  Social Connections: Not on file   Additional Social History:                         Sleep: Good  Appetite:  Good  Current Medications: Current Facility-Administered Medications  Medication Dose Route Frequency Provider Last Rate Last Admin  . acetaminophen (TYLENOL) tablet 650 mg  650 mg Oral Q6H PRN Laveda Abbe, NP   650 mg at 09/24/20 2123  . alum & mag hydroxide-simeth (MAALOX/MYLANTA) 200-200-20 MG/5ML suspension 30 mL  30 mL Oral Q4H PRN Laveda Abbe, NP      . buPROPion (WELLBUTRIN XL) 24 hr tablet 300 mg  300 mg Oral Daily Laveda Abbe, NP   300 mg at 09/25/20 0809  . busPIRone (BUSPAR) tablet 10 mg  10 mg Oral BID Claudie Revering, MD   10 mg at 09/25/20 0809  . fluticasone (FLONASE) 50 MCG/ACT nasal spray 1 spray  1 spray Each Nare Daily Laveda Abbe, NP   1 spray at 09/25/20 0809  . hydrOXYzine (ATARAX/VISTARIL)  tablet 25 mg  25 mg Oral TID PRN Laveda Abbe, NP      . levothyroxine (SYNTHROID) tablet 50 mcg  50 mcg Oral Q0600 Laveda Abbe, NP   50 mcg at 09/25/20 0617  . loratadine (CLARITIN) tablet 10 mg  10 mg Oral Daily Laveda Abbe, NP   10 mg at 09/25/20 0809  . magnesium hydroxide (  MILK OF MAGNESIA) suspension 30 mL  30 mL Oral Daily PRN Laveda Abbe, NP      . pantoprazole (PROTONIX) EC tablet 40 mg  40 mg Oral Daily Claudie Revering, MD   40 mg at 09/25/20 0809  . traZODone (DESYREL) tablet 50 mg  50 mg Oral QHS PRN Laveda Abbe, NP        Lab Results:  Results for orders placed or performed during the hospital encounter of 09/24/20 (from the past 48 hour(s))  TSH     Status: None   Collection Time: 09/25/20  6:22 AM  Result Value Ref Range   TSH 4.460 0.350 - 4.500 uIU/mL    Comment: Performed by a 3rd Generation assay with a functional sensitivity of <=0.01 uIU/mL. Performed at [facility], [address] [place], [place] [address]   Lipid panel     Status: Abnormal   Collection Time: 09/25/20  6:22 AM  Result Value Ref Range   Cholesterol 196 0 - 200 mg/dL   Triglycerides 604 (H) <150 mg/dL   HDL 47 >54 mg/dL   Total CHOL/HDL Ratio 4.2 RATIO   VLDL 38 0 - 40 mg/dL   LDL Cholesterol 098 (H) 0 - 99 mg/dL    Comment:        Total Cholesterol/HDL:CHD Risk Coronary Heart Disease Risk Table                     Men   Women  1/2 Average Risk   3.4   3.3  Average Risk       5.0   4.4  2 X Average Risk   9.6   7.1  3 X Average Risk  23.4   11.0        Use the calculated Patient Ratio above and the CHD Risk Table to determine the patient's CHD Risk.        ATP III CLASSIFICATION (LDL):  <100     mg/dL   Optimal  119-147  mg/dL   Near or Above                    Optimal  130-159  mg/dL   Borderline  829-562  mg/dL   High  >130     mg/dL   Very High Performed at [facility], [address],  [place], [place] [address]   Hemoglobin A1c     Status: None   Collection Time: 09/25/20  6:22 AM  Result Value Ref Range   Hgb A1c MFr Bld 5.3 4.8 - 5.6 %    Comment: (NOTE) Pre diabetes:          5.7%-6.4%  Diabetes:              >6.4%  Glycemic control for   <7.0% adults with diabetes    Mean Plasma Glucose 105.41 mg/dL    Comment: Performed at [facility], [address] [place], [place] [address]    Blood Alcohol level:  Lab Results  Component Value Date   ETH 148 (H) 09/22/2020    Metabolic Disorder Labs: Lab Results  Component Value Date   HGBA1C 5.3 09/25/2020   MPG 105.41 09/25/2020   No results found for: PROLACTIN Lab Results  Component Value Date   CHOL 196 09/25/2020   TRIG 189 (H) 09/25/2020   HDL 47 09/25/2020   CHOLHDL 4.2 09/25/2020   VLDL 38 09/25/2020  LDLCALC 111 (H) 09/25/2020   LDLCALC 157 (H) 10/31/2016    Physical Findings: AIMS:  , ,  ,  ,    CIWA:    COWS:     Musculoskeletal: Strength & Muscle Tone: within normal limits Gait & Station: normal Patient leans: N/A  Psychiatric Specialty Exam:  Presentation  General Appearance: Appropriate for Environment; Casual  Eye Contact:Good  Speech:Clear and Coherent; Normal Rate  Speech Volume:Normal  Handedness:Right   Mood and Affect  Mood:Anxious; Depressed  Affect:Appropriate   Thought Process  Thought Processes:Coherent; Goal Directed; Linear  Descriptions of Associations:Intact  Orientation:Full (Time, Place and Person)  Thought Content:Logical  History of Schizophrenia/Schizoaffective disorder:No  Duration of Psychotic Symptoms:No data recorded Hallucinations:Hallucinations: None  Ideas of Reference:None  Suicidal Thoughts:Suicidal Thoughts: No  Homicidal Thoughts:Homicidal Thoughts: No   Sensorium  Memory:Immediate Good; Recent Good; Remote Good  Judgment:Fair  Insight:Fair   Executive Functions  Concentration:Good  Attention  Span:Good  Recall:Good  Fund of Knowledge:Good  Language:Good   Psychomotor Activity  Psychomotor Activity:Psychomotor Activity: Normal   Assets  Assets:Communication Skills; Desire for Improvement; Physical Health; Financial Resources/Insurance; Resilience; Vocational/Educational   Sleep  Sleep:Sleep: Good    Physical Exam: Physical Exam Vitals and nursing note reviewed.  HENT:     Head: Normocephalic and atraumatic.  Pulmonary:     Effort: Pulmonary effort is normal.  Neurological:     General: No focal deficit present.     Mental Status: She is alert and oriented to person, place, and time.    Review of Systems  Constitutional: Negative for diaphoresis and fever.  HENT: Negative for sore throat.   Eyes: Negative for blurred vision.  Respiratory: Negative for cough and shortness of breath.   Cardiovascular: Negative for chest pain and palpitations.  Gastrointestinal: Negative for constipation, diarrhea, nausea and vomiting.  Genitourinary: Negative for dysuria.  Musculoskeletal: Negative for joint pain and myalgias.  Skin: Negative for rash.  Neurological: Negative for dizziness, tremors and headaches.  Endo/Heme/Allergies: Does not bruise/bleed easily.  Psychiatric/Behavioral: Positive for depression. Negative for suicidal ideas. The patient is nervous/anxious.    Blood pressure 122/69, pulse 62, temperature 97.9 F (36.6 C), temperature source Oral, resp. rate 16, height 4\' 11"  (1.499 m), weight 71.7 kg, SpO2 100 %. Body mass index is 31.91 kg/m.   Treatment Plan Summary: Daily contact with patient to assess and evaluate symptoms and progress in treatment, Medication management and Plan: The patient is a 55 year old female with a history of anxiety and depression and nonsuicidal self-injurious behavior who was admitted following a suicide attempt for treatment of symptoms of major depression.  Suspect cluster B traits likely contributing to symptoms.  Patient  is superficial in presentation and appears to be minimizing the symptoms of her depression.  She requires continued inpatient hospitalization for further stabilization and treatment of depression and for safety of patient.  Continue on IVC status.  Second commitment examination completed by this 40 today.  Continue every 15-minute observation status for safety of patient  Depression -Continue Wellbutrin XL 300 mg daily -Continue BuSpar 10 mg twice daily  Anxiety -Continue hydroxyzine 25 mg 3 times daily as needed anxiety  Insomnia -Continue trazodone 50 mg nightly as needed insomnia  Seasonal allergies -Continue Flonase, Claritin  Hypothyroidism -TSH WNL -Continue Synthroid 50 mcg daily  GERD -Continue Protonix 40 mg daily  Social work is working on Clinical research associate and obtaining appointments for aftercare with therapist and psychiatrist.  Patient would benefit from partial hospital program attendance.  She is reluctant to participate in partial hospital program at this time.  Continue to discuss with patient.  Housing plan is unclear at this time.  Social work is continuing to attempt clarification with patient.  I certify that inpatient services rendered can reasonably be expected to improve the patient's condition.  Claudie Revering, MD 09/25/2020, 4:13 PM

## 2020-09-25 NOTE — Progress Notes (Signed)
Pt denied SI/HI/AVH. RN administered medications and assessed for needs and concerns.  Pt reported that she is "feeling better" today emotionally.  RN encouraged pt to let staff know if pt needs assistance.  Pt remains safe on the unit with q 15 min checks in place.

## 2020-09-26 MED ORDER — SERTRALINE HCL 50 MG PO TABS
50.0000 mg | ORAL_TABLET | Freq: Every day | ORAL | Status: DC
  Administered 2020-09-26 – 2020-10-01 (×6): 50 mg via ORAL
  Filled 2020-09-26 (×7): qty 1

## 2020-09-26 NOTE — Plan of Care (Signed)
Nurse discussed anxiety, depression and coping skills with patient.  

## 2020-09-26 NOTE — [facility] Group Notes (Signed)
LCSW Group Therapy Note  09/26/2020   Type of Therapy and Topic:  Group Therapy - Healthy vs Unhealthy Coping Skills  Participation Level:  Active  Description of Group The focus of this group was to determine what unhealthy coping techniques typically are used by group members and what healthy coping techniques would be helpful in coping with various problems. Patients were guided in becoming aware of the differences between healthy and unhealthy coping techniques. Patients were asked to identify 2-3 healthy coping skills they would like to learn to use more effectively.  Therapeutic Goals 1. Patients learned that coping is what human beings do all day long to deal with various situations in their lives 2. Patients defined and discussed healthy vs unhealthy coping techniques 3. Patients identified their preferred coping techniques and identified whether these were healthy or unhealthy 4. Patients determined 2-3 healthy coping skills they would like to become more familiar with and use more often. 5. Patients provided support and ideas to each other   Summary of Patient Progress:  Pt was given packet that discussed stress and coping skills to use to deal with stress. Pt was advised to ask to speak to CSW if they had questions or would like to discuss contents of the packet.    Therapeutic Modalities Cognitive Behavioral Therapy Motivational Interviewing  Preeya Cleckley M Daylin Eads, LCSWA 09/26/2020  2:59 PM    

## 2020-09-26 NOTE — Progress Notes (Signed)
D: Patient presents with appropriate and pleasant affect. Patient reports feeling sad throughout the day but reports that she was feeling a little better at time of assessment. Patient denies SI/HI at this time. Patient also denies AH/VH at this time. Patient contracts for safety.  A: Provided positive reinforcement and encouragement.  R: Patient cooperative and receptive to efforts. Patient remains safe on the unit.   09/26/20 2105  Psych Admission Type (Psych Patients Only)  Admission Status Involuntary  Psychosocial Assessment  Patient Complaints Insomnia;Depression  Eye Contact Brief  Facial Expression Flat  Affect Appropriate to circumstance  Speech Logical/coherent  Interaction Assertive  Motor Activity Other (Comment) (WDL)  Appearance/Hygiene Unremarkable  Behavior Characteristics Cooperative;Appropriate to situation  Mood Depressed;Anxious  Thought Process  Coherency WDL  Content WDL  Delusions None reported or observed  Perception WDL  Hallucination None reported or observed  Judgment WDL  Confusion None  Danger to Self  Current suicidal ideation? Denies  Danger to Others  Danger to Others None reported or observed

## 2020-09-26 NOTE — Progress Notes (Signed)
The patient shared in group last evening that she was proud of the fact that she did not have any suicidal thoughts. She mentioned that she has a history of past attempts and that she has recently suffered multiple losses, ie. Job, Metallurgist. Her goal for tomorrow is to speak with her son who is very angry with her.

## 2020-09-26 NOTE — Progress Notes (Signed)
The patient expressed in group that she had a bad day overall but did not go into further detail . She did say that she had a good talk with her sister. Her goal for tomorrow is to have a better day.

## 2020-09-26 NOTE — [facility] Counselor (Signed)
CSW spoke with pt's husband, Shaleen Talamantez (husband) [phone]. Mr. Juanetta Gosling shared that he does not feel the pt is confronting her depression. "My son is traumatized. I don't feel her going back to the home in [facility] is the best because it causes a lot of anxiety for our son. I want her sister, Gunnar Fusi to be involved in her care too.". When asked when pt will live at discharge Mr. Marquis stated he is not sure at this time and that he would contact CSW after he speaks to pt's sister. Mr. Vanstone states he is currently removing the firearm from the home in [facility] where the son is living. Mr. Russman states that pt's sister will provide CSW with the plan for pt's aftercare living and the plan for informing pt that she cannot return to [place].    12:30-1:30p CSW spoke with pt's sister Ventura Sellers [phone] (international #, pt lives in [place]). Ms. Larence Penning shared that she is worried that pt will spiral when she learns that she cannot return to the home in [facility]. Ms. Larence Penning states that she will talk with pt about her aftercare options but asked what is recommended by her [facility] team. CSW explained that Dr. Fayrene Fearing and CSW spoke w/ pt this morning and recommended that she pursue either residential mental health treatment or PHP as well as seeing a psychiatrist for her medications. CSW also explained that although these are our recommendations, due to pt being her own guardian she has full choice over her aftercare. Ms. Larence Penning agreed on aftercare recommendations and stated she understood about pt's right to choose. Ms. Larence Penning states that she feels her sister is in a lot of emotional pain due to the separation of her and her husband. Ms. Larence Penning stated that she would discuss with pt about what the pt would like her aftercare plan to look like and explain to her that at this time she cannot return to the home in [facility], if her son is going to be living there. Ms. Larence Penning states that she feels it  is best that pt stay in [place] at this time for treatment. Ms. Larence Penning has expressed that she would like if the pt attends residential for it to be in [place] due to their father living there. CSW discussed [facility] as a possible resource but reminded Ms. Bullard that pt would have to agree to this service.   Fredirick Lathe, LCSWA Clinicial Social Worker [employer]

## 2020-09-26 NOTE — Progress Notes (Signed)
D:  Patient's self inventory sheet, patient has fair sleep, no sleep medication.  Good appetite, normal energy level, good concentration.  Rated depression 6, denied hopeless and anxiety.  Denied withdrawals.  Denied SI.  Denied physical problems.  Denied physical pain.  Goal is coping skills.  Plans to think of ways to distract when depressed.  No discharge plans. A:  Medications administered per MD orders.  Emotional support and encouragement given patient. R:  Denied SI and HI, contracts for safety.  Denied A/V hallucinations.  Safety maintained with 15 minute checks.

## 2020-09-26 NOTE — Tx Team (Signed)
Interdisciplinary Treatment and Diagnostic Plan Update  09/26/2020 Time of Session: 9:00am Hannah [name] MRN: [id number]  Principal Diagnosis: MDD (major depressive disorder), recurrent severe, without psychosis ([place])  Secondary Diagnoses: Principal Problem:   MDD (major depressive disorder), recurrent severe, without psychosis ([place]) Active Problems:   Anxiety disorder   Suicide attempt ([place])   Current Medications:  Current Facility-Administered Medications  Medication Dose Route Frequency Provider Last Rate Last Admin  . acetaminophen (TYLENOL) tablet 650 mg  650 mg Oral Q6H PRN Ethelene Hal, NP   650 mg at 09/24/20 2123  . alum & mag hydroxide-simeth (MAALOX/MYLANTA) 200-200-20 MG/5ML suspension 30 mL  30 mL Oral Q4H PRN Ethelene Hal, NP      . buPROPion (WELLBUTRIN XL) 24 hr tablet 300 mg  300 mg Oral Daily Ethelene Hal, NP   300 mg at 09/26/20 0746  . busPIRone (BUSPAR) tablet 10 mg  10 mg Oral BID Arthor Captain, MD   10 mg at 09/26/20 0746  . fluticasone (FLONASE) 50 MCG/ACT nasal spray 1 spray  1 spray Each Nare Daily Ethelene Hal, NP   1 spray at 09/26/20 0746  . hydrOXYzine (ATARAX/VISTARIL) tablet 25 mg  25 mg Oral TID PRN Ethelene Hal, NP      . levothyroxine (SYNTHROID) tablet 50 mcg  50 mcg Oral Q0600 Ethelene Hal, NP   50 mcg at 09/26/20 0747  . loratadine (CLARITIN) tablet 10 mg  10 mg Oral Daily Ethelene Hal, NP   10 mg at 09/26/20 0747  . magnesium hydroxide (MILK OF MAGNESIA) suspension 30 mL  30 mL Oral Daily PRN Ethelene Hal, NP      . pantoprazole (PROTONIX) EC tablet 40 mg  40 mg Oral Daily Arthor Captain, MD   40 mg at 09/26/20 0747  . traZODone (DESYREL) tablet 50 mg  50 mg Oral QHS PRN Ethelene Hal, NP       PTA Medications: Medications Prior to Admission  Medication Sig Dispense Refill Last Dose  . buPROPion (WELLBUTRIN XL) 150 MG 24 hr tablet Take 150 mg by mouth daily.      . busPIRone (BUSPAR) 10 MG tablet Take 10 mg by mouth 3 (three) times daily as needed.     . cetirizine (ZYRTEC) 10 MG tablet Take 10 mg by mouth daily.     . fluticasone (FLONASE) 50 MCG/ACT nasal spray Place into both nostrils daily.     Marland Kitchen levothyroxine (SYNTHROID) 50 MCG tablet Take 50 mcg by mouth daily before breakfast.     . omeprazole (PRILOSEC) 40 MG capsule Take 40 mg by mouth daily.       Patient Stressors: Loss of marriage Marital or family conflict  Patient Strengths: Ability for insight Average or above average intelligence Capable of independent living Occupational psychologist fund of knowledge Physical Health Supportive family/friends  Treatment Modalities: Medication Management, Group therapy, Case management,  1 to 1 session with clinician, Psychoeducation, Recreational therapy.   Physician Treatment Plan for Primary Diagnosis: MDD (major depressive disorder), recurrent severe, without psychosis ([place]) Long Term Goal(s): Improvement in symptoms so as ready for discharge Improvement in symptoms so as ready for discharge   Short Term Goals: Ability to identify changes in lifestyle to reduce recurrence of condition will improve Ability to verbalize feelings will improve Ability to disclose and discuss suicidal ideas Ability to identify and develop effective coping behaviors will improve Ability to maintain clinical measurements within normal  limits will improve Ability to identify triggers associated with substance abuse/mental health issues will improve Ability to identify changes in lifestyle to reduce recurrence of condition will improve Ability to verbalize feelings will improve Ability to disclose and discuss suicidal ideas Ability to identify and develop effective coping behaviors will improve Ability to maintain clinical measurements within normal limits will improve Ability to identify triggers associated with substance abuse/mental  health issues will improve  Medication Management: Evaluate patient's response, side effects, and tolerance of medication regimen.  Therapeutic Interventions: 1 to 1 sessions, Unit Group sessions and Medication administration.  Evaluation of Outcomes: Not Met  Physician Treatment Plan for Secondary Diagnosis: Principal Problem:   MDD (major depressive disorder), recurrent severe, without psychosis ([place]) Active Problems:   Anxiety disorder   Suicide attempt ([place])  Long Term Goal(s): Improvement in symptoms so as ready for discharge Improvement in symptoms so as ready for discharge   Short Term Goals: Ability to identify changes in lifestyle to reduce recurrence of condition will improve Ability to verbalize feelings will improve Ability to disclose and discuss suicidal ideas Ability to identify and develop effective coping behaviors will improve Ability to maintain clinical measurements within normal limits will improve Ability to identify triggers associated with substance abuse/mental health issues will improve Ability to identify changes in lifestyle to reduce recurrence of condition will improve Ability to verbalize feelings will improve Ability to disclose and discuss suicidal ideas Ability to identify and develop effective coping behaviors will improve Ability to maintain clinical measurements within normal limits will improve Ability to identify triggers associated with substance abuse/mental health issues will improve     Medication Management: Evaluate patient's response, side effects, and tolerance of medication regimen.  Therapeutic Interventions: 1 to 1 sessions, Unit Group sessions and Medication administration.  Evaluation of Outcomes: Not Met   RN Treatment Plan for Primary Diagnosis: MDD (major depressive disorder), recurrent severe, without psychosis ([place]) Long Term Goal(s): Knowledge of disease and therapeutic regimen to maintain health will improve  Short  Term Goals: Ability to remain free from injury will improve, Ability to participate in decision making will improve, Ability to identify and develop effective coping behaviors will improve and Compliance with prescribed medications will improve  Medication Management: RN will administer medications as ordered by provider, will assess and evaluate patient's response and provide education to patient for prescribed medication. RN will report any adverse and/or side effects to prescribing provider.  Therapeutic Interventions: 1 on 1 counseling sessions, Psychoeducation, Medication administration, Evaluate responses to treatment, Monitor vital signs and CBGs as ordered, Perform/monitor CIWA, COWS, AIMS and Fall Risk screenings as ordered, Perform wound care treatments as ordered.  Evaluation of Outcomes: Not Met   LCSW Treatment Plan for Primary Diagnosis: MDD (major depressive disorder), recurrent severe, without psychosis ([place]) Long Term Goal(s): Safe transition to appropriate next level of care at discharge, Engage patient in therapeutic group addressing interpersonal concerns.  Short Term Goals: Engage patient in aftercare planning with referrals and resources, Increase social support, Increase emotional regulation, Identify triggers associated with mental health/substance abuse issues and Increase skills for wellness and recovery  Therapeutic Interventions: Assess for all discharge needs, 1 to 1 time with Social worker, Explore available resources and support systems, Assess for adequacy in community support network, Educate family and significant other(s) on suicide prevention, Complete Psychosocial Assessment, Interpersonal group therapy.  Evaluation of Outcomes: Not Met   Progress in Treatment: Attending groups: Yes. Participating in groups: Yes. Taking medication as prescribed: Yes. Toleration  medication: Yes. Family/Significant other contact made: Yes, individual(s) contacted:   son Patient understands diagnosis: Yes. Discussing patient identified problems/goals with staff: Yes. Medical problems stabilized or resolved: Yes. Denies suicidal/homicidal ideation: Yes. Issues/concerns per patient self-inventory: No.   New problem(s) identified: No, Describe:  none  New Short Term/Long Term Goal(s): medication stabilization, elimination of SI thoughts, development of comprehensive mental wellness plan.   Patient Goals:  "To figure out ways to move forward"  Discharge Plan or Barriers: Pt is unable to return to live at discharge. CSW and pt will continue to seek appropriate placement. Pt is established with a virtual therapist and receives medication management through her primary care provider and does not want to change services.   Reason for Continuation of Hospitalization: Anxiety Depression Medication stabilization Suicidal ideation  Estimated Length of Stay: 3-5 days  Attendees: Patient: Hannah [name] 09/26/2020   Physician: Ethelene Browns, MD 09/26/2020   Nursing:  09/26/2020   RN Care Manager: 09/26/2020  Social Worker: Darletta Moll, LCSW 09/26/2020   Recreational Therapist:  09/26/2020   Other:  09/26/2020   Other:  09/26/2020   Other: 09/26/2020     Scribe for Treatment Team: Vassie Moselle, LCSW 09/26/2020 9:51 AM

## 2020-09-26 NOTE — Progress Notes (Signed)
Recreation Therapy Notes  Date: 3.30.22 Time: 0930 Location: 300 Hall Dayroom  Group Topic: Stress Management   Goal Area(s) Addresses:  Patient will actively participate in stress management techniques presented during session.   Behavioral Response: Engaged  Intervention: Stress management techniques  Activity :Guided Imagery  LRT read a script that focused on being in a peaceful meadow during the spring time.  Patients were to listen and follow along as the meditation played to fully participate in the activity.     Education:  Stress Management, Discharge Planning.   Education Outcome: Acknowledges education  Clinical Observations/Feedback: Patient actively engaged in technique.  Patient was called out of group by doctor.  Patient returned just as group was ending.    Caroll Rancher, LRT/CTRS    Caroll Rancher A 09/26/2020 10:56 AM

## 2020-09-26 NOTE — Progress Notes (Signed)
[facility] MD Progress Note  09/26/2020 4:22 PM Hannah [name]  MRN:  [id number]    Hannah Murillois a 55 year old female with a history of depression and anxiety who was taken to a local emergency department by law enforcement after she made a suicide attempt by carbon monoxide poisoning and texted her husband that she was going to commit suicide while in a locked car with the ignition running.   Subjective: Patient seen and interviewed.  Medical record reviewed.  Patient's case discussed in detail with members of the treatment team.  On interview today the patient describes her mood as "sad."  She states she did not sleep well last night.  She feels overwhelmed at times by all of the changes in her life in recent months.  Appetite is okay.  The patient denies wish for death or suicidal ideation intent preparation or plan.  She denies AI, HI, AH, VH.  She reports concern that the son with whom she resided prior to admission may be angry with her.  She gives permission for her sister to be contacted and for her husband to be contacted by the treatment team.  The patient acknowledges that she did engage in self-harm in the past by using a hammer as her son told social worker via phone call yesterday.  Objective: I met with patient and team social worker in the interview room this morning.  During our meeting with patient today her affect was tearful and constricted and she appeared more depressed.  Thought processes are coherent and goal-directed.  She is denying any wishes for death or suicidal ideation intent preparation or plan.  I encouraged patient to use as needed trazodone for insomnia tonight.  We discussed stopping BuSpar and starting Zoloft as well as continuing Wellbutrin XL.  Patient is in agreement with this plan.  We advised the patient to participate in partial hospital program or residential program after discharge for further treatment of her depression and anxiety symptoms.  Patient will  consider.  We also discussed with patient the importance of seeing a psychiatrist rather than having her primary care provider manage her medications.  Additionally, we recommended the patient see a therapist locally rather than using online therapy through an app.  Patient expressed understanding of the recommendations made.  Per chart notes the patient was pleasant last night and was social with peers in the day room.  She did not engage in any self-injurious behavior on the unit and denied suicidal ideation to staff..  Patient has been attending groups and is participating appropriately.  Her vital signs are within normal limits.  Principal Problem: MDD (major depressive disorder), recurrent severe, without psychosis ([place]) Diagnosis: Principal Problem:   MDD (major depressive disorder), recurrent severe, without psychosis ([place]) Active Problems:   Anxiety disorder   Suicide attempt ([place])  Total Time spent with patient: 25 minutes  Past Psychiatric History: See H&P  Past Medical History:  Past Medical History:  Diagnosis Date  . Allergy   . Anxiety   . Asthma   . Depression   . GERD (gastroesophageal reflux disease)   . Migraine     Past Surgical History:  Procedure Laterality Date  . ABDOMINAL HYSTERECTOMY  2004  . APPENDECTOMY  2004  . CERVICAL CONIZATION W/BX  11/1995  . CESAREAN SECTION      x2  . laposcophy  05/1996  . TONSILLECTOMY    . TUBAL LIGATION    . WISDOM TOOTH EXTRACTION     x  4   Family History:  Family History  Adopted: Yes  Problem Relation Age of Onset  . Arthritis Mother   . Depression Mother   . Hypertension Mother   . Heart disease Mother        CHF  . Kidney disease Mother   . Diabetes Father   . Depression Sister   . Cancer Brother        colon, lungs, liver  . Diabetes Paternal Grandmother    Family Psychiatric  History: See H&P Social History:  Social History   Substance and Sexual Activity  Alcohol Use Yes   Comment: occasion "rare"      Social History   Substance and Sexual Activity  Drug Use No    Social History   Socioeconomic History  . Marital status: Married    Spouse name: Not on file  . Number of children: Not on file  . Years of education: Not on file  . Highest education level: Not on file  Occupational History  . Not on file  Tobacco Use  . Smoking status: Former Smoker    Packs/day: 1.00    Years: 9.00    Pack years: 9.00    Types: Cigarettes    Quit date: 12/28/1990    Years since quitting: 29.7  . Smokeless tobacco: Never Used  . Tobacco comment: quit 1992  Vaping Use  . Vaping Use: Every day  Substance and Sexual Activity  . Alcohol use: Yes    Comment: occasion "rare"  . Drug use: No  . Sexual activity: Not Currently    Partners: Male    Birth control/protection: Surgical  Other Topics Concern  . Not on file  Social History Narrative  . Not on file   Social Determinants of Health   Financial Resource Strain: Not on file  Food Insecurity: Not on file  Transportation Needs: Not on file  Physical Activity: Not on file  Stress: Not on file  Social Connections: Not on file   Additional Social History:                         Sleep: Poor  Appetite:  Fair  Current Medications: Current Facility-Administered Medications  Medication Dose Route Frequency Provider Last Rate Last Admin  . acetaminophen (TYLENOL) tablet 650 mg  650 mg Oral Q6H PRN Ethelene Hal, NP   650 mg at 09/24/20 2123  . alum & mag hydroxide-simeth (MAALOX/MYLANTA) 200-200-20 MG/5ML suspension 30 mL  30 mL Oral Q4H PRN Ethelene Hal, NP      . buPROPion (WELLBUTRIN XL) 24 hr tablet 300 mg  300 mg Oral Daily Ethelene Hal, NP   300 mg at 09/26/20 0746  . fluticasone (FLONASE) 50 MCG/ACT nasal spray 1 spray  1 spray Each Nare Daily Ethelene Hal, NP   1 spray at 09/26/20 0746  . hydrOXYzine (ATARAX/VISTARIL) tablet 25 mg  25 mg Oral TID PRN Ethelene Hal, NP       . levothyroxine (SYNTHROID) tablet 50 mcg  50 mcg Oral Q0600 Ethelene Hal, NP   50 mcg at 09/26/20 0747  . loratadine (CLARITIN) tablet 10 mg  10 mg Oral Daily Ethelene Hal, NP   10 mg at 09/26/20 0747  . magnesium hydroxide (MILK OF MAGNESIA) suspension 30 mL  30 mL Oral Daily PRN Ethelene Hal, NP      . pantoprazole (PROTONIX) EC tablet 40 mg  40 mg  Oral Daily Arthor Captain, MD   40 mg at 09/26/20 0747  . sertraline (ZOLOFT) tablet 50 mg  50 mg Oral Daily Arthor Captain, MD   50 mg at 09/26/20 1115  . traZODone (DESYREL) tablet 50 mg  50 mg Oral QHS PRN Ethelene Hal, NP        Lab Results:  Results for orders placed or performed during the hospital encounter of 09/24/20 (from the past 48 hour(s))  TSH     Status: None   Collection Time: 09/25/20  6:22 AM  Result Value Ref Range   TSH 4.460 0.350 - 4.500 uIU/mL    Comment: Performed by a 3rd Generation assay with a functional sensitivity of <=0.01 uIU/mL. Performed at [facility], [place] [address], [place], [place] [address]   Lipid panel     Status: Abnormal   Collection Time: 09/25/20  6:22 AM  Result Value Ref Range   Cholesterol 196 0 - 200 mg/dL   Triglycerides 189 (H) <150 mg/dL   HDL 47 >40 mg/dL   Total CHOL/HDL Ratio 4.2 RATIO   VLDL 38 0 - 40 mg/dL   LDL Cholesterol 111 (H) 0 - 99 mg/dL    Comment:        Total Cholesterol/HDL:CHD Risk Coronary Heart Disease Risk Table                     Men   Women  1/2 Average Risk   3.4   3.3  Average Risk       5.0   4.4  2 X Average Risk   9.6   7.1  3 X Average Risk  23.4   11.0        Use the calculated Patient Ratio above and the CHD Risk Table to determine the patient's CHD Risk.        ATP III CLASSIFICATION (LDL):  <100     mg/dL   Optimal  100-129  mg/dL   Near or Above                    Optimal  130-159  mg/dL   Borderline  160-189  mg/dL   High  >190     mg/dL   Very High Performed at [facility] [address] [place], [place] [address]   Hemoglobin A1c     Status: None   Collection Time: 09/25/20  6:22 AM  Result Value Ref Range   Hgb A1c MFr Bld 5.3 4.8 - 5.6 %    Comment: (NOTE) Pre diabetes:          5.7%-6.4%  Diabetes:              >6.4%  Glycemic control for   <7.0% adults with diabetes    Mean Plasma Glucose 105.41 mg/dL    Comment: Performed at [facility] [address], [place], [place] [address]    Blood Alcohol level:  Lab Results  Component Value Date   ETH 148 (H) 63/14/9702    Metabolic Disorder Labs: Lab Results  Component Value Date   HGBA1C 5.3 09/25/2020   MPG 105.41 09/25/2020   No results found for: PROLACTIN Lab Results  Component Value Date   CHOL 196 09/25/2020   TRIG 189 (H) 09/25/2020   HDL 47 09/25/2020   CHOLHDL 4.2 09/25/2020   VLDL 38 09/25/2020   LDLCALC 111 (H) 09/25/2020   LDLCALC 157 (  H) 10/31/2016    Physical Findings: AIMS:  , ,  ,  ,    CIWA:    COWS:     Musculoskeletal: Strength & Muscle Tone: within normal limits Gait & Station: normal Patient leans: N/A  Psychiatric Specialty Exam:  Presentation  General Appearance: Appropriate for Environment; Casual  Eye Contact:Fair  Speech:Clear and Coherent; Normal Rate  Speech Volume:Normal  Handedness:Right   Mood and Affect  Mood:Anxious; Depressed ("sad")  Affect:Constricted; Tearful   Thought Process  Thought Processes:Coherent; Goal Directed  Descriptions of Associations:Intact  Orientation:Full (Time, Place and Person)  Thought Content:Logical; Rumination  History of Schizophrenia/Schizoaffective disorder:No  Duration of Psychotic Symptoms:No data recorded Hallucinations:Hallucinations: None  Ideas of Reference:None  Suicidal Thoughts:Suicidal Thoughts: No  Homicidal Thoughts:Homicidal Thoughts: No   Sensorium  Memory:Immediate Good; Recent Good; Remote  Good  Judgment:Fair  Insight:Fair   Executive Functions  Concentration:Fair  Attention Span:Fair  [place]um of Knowledge:Good  Language:Good   Psychomotor Activity  Psychomotor Activity:Psychomotor Activity: Normal   Assets  Assets:Communication Skills; Desire for Improvement; Housing; Catering manager; Physical Health; Resilience; Social Support; Vocational/Educational   Sleep  Sleep:Sleep: Poor    Physical Exam: Physical Exam Vitals and nursing note reviewed.  HENT:     Head: Normocephalic and atraumatic.  Pulmonary:     Effort: Pulmonary effort is normal.  Neurological:     General: No focal deficit present.     Mental Status: She is alert and oriented to person, place, and time.    Review of Systems  Constitutional: Negative for chills and fever.  HENT: Negative for sore throat.   Eyes: Negative for blurred vision.  Respiratory: Negative for cough and shortness of breath.   Cardiovascular: Negative for chest pain and palpitations.  Gastrointestinal: Negative for constipation, diarrhea, nausea and vomiting.  Genitourinary: Negative for dysuria.  Musculoskeletal: Negative for joint pain and myalgias.  Skin: Negative for rash.  Neurological: Negative for dizziness and headaches.  Endo/Heme/Allergies: Does not bruise/bleed easily.  Psychiatric/Behavioral: Positive for depression. Negative for hallucinations and suicidal ideas. The patient is nervous/anxious and has insomnia.    Blood pressure 121/79, pulse 69, temperature 97.9 F (36.6 C), temperature source Oral, resp. rate 18, height $RemoveBe'4\' 11"'lDltjuLcR$  (1.499 m), weight 71.7 kg, SpO2 100 %. Body mass index is 31.91 kg/m.   Treatment Plan Summary: Daily contact with patient to assess and evaluate symptoms and progress in treatment and Medication management  The patient is a 55 year old female with a history of anxiety and depression and nonsuicidal self-injurious behavior who was admitted  following a suicide attempt for treatment of symptoms of major depression.  Suspect cluster B traits likely contributing to symptoms.  Patient is superficial in presentation and appears to be minimizing the symptoms of her depression.  She requires continued inpatient hospitalization for further stabilization and treatment of depression and for safety of patient.  Continue on IVC status  Continue every 15-minute observation level for safety of patient  Depression/anxiety -Continue Wellbutrin XL 300 mg daily -Continue BuSpar due to unclear effect.  Patient has been taking since November.  Family members have taken [place] and have reported adverse effect on mood and anxiety symptoms. -Start sertraline 50 mg daily (started 09/26/2020) -Continue hydroxyzine 25 mg 3 times daily as needed for anxiety  Insomnia -Continue trazodone 50 mg nightly as needed for insomnia  Seasonal allergies -Continue Flonase, Claritin  Hypothyroidism -TSH WNL -Continue Synthroid 50 mcg daily  GERD -Continue Protonix 40 mg daily  Social work is working  on determining housing plan for patient after discharge.  Housing plan remains unclear at this time.  Team has advised patient of the recommendation for her to participate in partial hospital program or residential program for further treatment of her depression and anxiety after discharge.  Team has also advised patient of the need to see a psychiatrist as an outpatient for medication management rather than seeing her primary care provider for medication management.  We also discussed with patient the recommendation of following up with a therapist regularly.  Team will provide assistance with finding therapist.   I certify that inpatient services furnished can reasonably be expected to improve the patient's condition.  Arthor Captain, MD 09/26/2020, 4:22 PM

## 2020-09-27 MED ORDER — MELATONIN 3 MG PO TABS
3.0000 mg | ORAL_TABLET | Freq: Every day | ORAL | Status: DC
  Administered 2020-09-27 – 2020-10-01 (×5): 3 mg via ORAL
  Filled 2020-09-27 (×7): qty 1

## 2020-09-27 MED ORDER — TRAZODONE HCL 50 MG PO TABS
25.0000 mg | ORAL_TABLET | Freq: Every evening | ORAL | Status: DC | PRN
  Administered 2020-09-27 – 2020-10-01 (×5): 25 mg via ORAL
  Filled 2020-09-27 (×5): qty 1

## 2020-09-27 NOTE — Progress Notes (Signed)
D: Patient presents sad affect but reports her mood has improved a little. Patient denies SI/HI at this time. Patient also denies AH/VH at this time. Patient contracts for safety.  A: Provided positive reinforcement and encouragement.  R: Patient cooperative and receptive to efforts. Patient remains safe on the unit.   09/27/20 2108  Psych Admission Type (Psych Patients Only)  Admission Status Involuntary  Psychosocial Assessment  Patient Complaints Depression;Sadness  Eye Contact Brief  Facial Expression Sad;Sullen  Affect Appropriate to circumstance  Speech Logical/coherent  Interaction Assertive  Motor Activity Other (Comment) (WDL)  Appearance/Hygiene Unremarkable  Thought Process  Coherency WDL  Content WDL  Delusions None reported or observed  Perception WDL  Hallucination None reported or observed  Judgment WDL  Confusion None  Danger to Self  Current suicidal ideation? Denies  Danger to Others  Danger to Others None reported or observed

## 2020-09-27 NOTE — Progress Notes (Signed)
[facility] Group Notes:  (Nursing/MHT/Case Management/Adjunct)  Date:  09/27/2020  Time:  2015  Type of Therapy:  wrap up group  Participation Level:  Active  Participation Quality:  Appropriate, Attentive, Sharing and Supportive  Affect:  Depressed  Cognitive:  Appropriate  Insight:  Improving  Engagement in Group:  Engaged  Modes of Intervention:  Clarification, Education and Support  Summary of Progress/Problems: Positive thinking and positive change were discussed.   Marcille Buffy 09/27/2020, 10:28 PM

## 2020-09-27 NOTE — Progress Notes (Signed)
Pt said that her mood is improving today and she denied SI/HI/AVH.  Pt reported that she had a hard day yesterday "emotionally dealing with my sister and there were some things that needed to be dealt with".  Pt says that she is hopeful her mood will continue to get better throughout the day. Pt remains safe on the unit with q 15 min checks in place.

## 2020-09-27 NOTE — Progress Notes (Signed)
[facility]  09/27/2020 4:23 PM Hannah RaddleKelly B Stepter  MRN:  [id number] Subjective: Patient seen and interviewed.  Medical record reviewed.  Patient's case discussed in detail with members of the treatment team.  On interview today, the patient reports "I feel better today."  She states she feels not as depressed and rates her depression as 3 out of 10 in intensity.  Patient states she felt bad after she spoke with her sister on the phone and had some difficult conversations with family members by phone in the afternoon yesterday.  She denies anxiety.  The patient reports chronic fleeting wishes that she were not alive and chronic bleeding thoughts of ending her life that occur when she gets overwhelmed.  She states she has had these thoughts for at least a month prior to admission.  The patient describes these thoughts as chronic brief flashes of images of harming herself or killing herself via carbon monoxide poisoning or hanging herself but she denies intent preparation or plan to act on these thoughts.  The patient states that these fleeting thoughts of suicide have decreased since she was admitted.  She denies hitting herself or other nonsuicidal self-injurious behavior or urges to engage in any such behavior since admission.  The patient reports that she has made a total of 2 suicide attempts in her life.  She attempted to poison herself with carbon monoxide in her car right after her husband left but got out of the car on her own and aborted the attempt.  Her second attempt was the one just prior to the current admission when she consumed alcohol and again attempted to poison herself with carbon monoxide by locking herself in the car in the garage with the ignition on and then texting her husband.  She denies other suicide attempts in her lifetime.  Patient states she is decided to attend residential treatment program after discharge.  She reports that she took trazodone last night and feels tired and  oversedated today.  She also is experiencing mild nausea but denies other medication side effects.  Appetite is fair.  Sleep is okay.  She denies AI, HI, VH, AH, PI.  Per chart review and nursing staff report, the patient has been taking standing dose medications as prescribed.  She took as needed hydroxyzine and as needed trazodone last night at 9 PM for anxiety and sleep.  The patient has not engaged in any self-injurious behavior or aggressive behavior on the unit.  She has been attending groups.  On interview with me today she maintains good eye contact.  Speech is of normal rate and amount.  She is normokinetic.  Affect is constricted but not tearful.  Thought processes are linear, coherent and goal-directed.  Attention and concentration are intact.  Insight and judgment are fair.  Principal Problem: MDD (major depressive disorder), recurrent severe, without psychosis (HCC) Diagnosis: Principal Problem:   MDD (major depressive disorder), recurrent severe, without psychosis (HCC) Active Problems:   Anxiety disorder   Suicide attempt (HCC)  Total Time spent with patient: 20 minutes  Past Psychiatric History: See H&P.  Patient reported today that a history of 2 prior suicide attempts both by carbon monoxide poisoning.  The first attempt occurred just after her husband left her and she got out of the car on her own thereby aborting the attempt.  The second attempt was the one that precipitated the current admission.  Past Medical History:  Past Medical History:  Diagnosis Date  . Allergy   .  Anxiety   . Asthma   . Depression   . GERD (gastroesophageal reflux disease)   . Migraine     Past Surgical History:  Procedure Laterality Date  . ABDOMINAL HYSTERECTOMY  2004  . APPENDECTOMY  2004  . CERVICAL CONIZATION W/BX  11/1995  . CESAREAN SECTION      x2  . laposcophy  05/1996  . TONSILLECTOMY    . TUBAL LIGATION    . WISDOM TOOTH EXTRACTION     x 4   Family History:  Family History   Adopted: Yes  Problem Relation Age of Onset  . Arthritis Mother   . Depression Mother   . Hypertension Mother   . Heart disease Mother        CHF  . Kidney disease Mother   . Diabetes Father   . Depression Sister   . Cancer Brother        colon, lungs, liver  . Diabetes Paternal Grandmother    Family Psychiatric  History: See H&P Social History:  Social History   Substance and Sexual Activity  Alcohol Use Yes   Comment: occasion "rare"     Social History   Substance and Sexual Activity  Drug Use No    Social History   Socioeconomic History  . Marital status: Married    Spouse name: Not on file  . Number of children: Not on file  . Years of education: Not on file  . Highest education level: Not on file  Occupational History  . Not on file  Tobacco Use  . Smoking status: Former Smoker    Packs/day: 1.00    Years: 9.00    Pack years: 9.00    Types: Cigarettes    Quit date: 12/28/1990    Years since quitting: 29.7  . Smokeless tobacco: Never Used  . Tobacco comment: quit 1992  Vaping Use  . Vaping Use: Every day  Substance and Sexual Activity  . Alcohol use: Yes    Comment: occasion "rare"  . Drug use: No  . Sexual activity: Not Currently    Partners: Male    Birth control/protection: Surgical  Other Topics Concern  . Not on file  Social History Narrative  . Not on file   Social Determinants of Health   Financial Resource Strain: Not on file  Food Insecurity: Not on file  Transportation Needs: Not on file  Physical Activity: Not on file  Stress: Not on file  Social Connections: Not on file   Additional Social History:                         Sleep: Fair  Appetite:  Fair  Current Medications: Current Facility-Administered Medications  Medication Dose Route Frequency Provider Last Rate Last Admin  . acetaminophen (TYLENOL) tablet 650 mg  650 mg Oral Q6H PRN Laveda Abbe, NP   650 mg at 09/24/20 2123  . alum & mag  hydroxide-simeth (MAALOX/MYLANTA) 200-200-20 MG/5ML suspension 30 mL  30 mL Oral Q4H PRN Laveda Abbe, NP      . buPROPion (WELLBUTRIN XL) 24 hr tablet 300 mg  300 mg Oral Daily Laveda Abbe, NP   300 mg at 09/27/20 0741  . fluticasone (FLONASE) 50 MCG/ACT nasal spray 1 spray  1 spray Each Nare Daily Laveda Abbe, NP   1 spray at 09/27/20 0740  . hydrOXYzine (ATARAX/VISTARIL) tablet 25 mg  25 mg Oral TID PRN Laveda Abbe, NP  25 mg at 09/26/20 2105  . levothyroxine (SYNTHROID) tablet 50 mcg  50 mcg Oral Q0600 Laveda Abbe, NP   50 mcg at 09/27/20 [phone]  . loratadine (CLARITIN) tablet 10 mg  10 mg Oral Daily Laveda Abbe, NP   10 mg at 09/27/20 0741  . magnesium hydroxide (MILK OF MAGNESIA) suspension 30 mL  30 mL Oral Daily PRN Laveda Abbe, NP      . pantoprazole (PROTONIX) EC tablet 40 mg  40 mg Oral Daily Claudie Revering, MD   40 mg at 09/27/20 0741  . sertraline (ZOLOFT) tablet 50 mg  50 mg Oral Daily Claudie Revering, MD   50 mg at 09/27/20 0741  . traZODone (DESYREL) tablet 50 mg  50 mg Oral QHS PRN Laveda Abbe, NP   50 mg at 09/26/20 2105    Lab Results: No results found for this or any previous visit (from the past 48 hour(s)).  Blood Alcohol level:  Lab Results  Component Value Date   ETH 148 (H) 09/22/2020    Metabolic Disorder Labs: Lab Results  Component Value Date   HGBA1C 5.3 09/25/2020   MPG 105.41 09/25/2020   No results found for: PROLACTIN Lab Results  Component Value Date   CHOL 196 09/25/2020   TRIG 189 (H) 09/25/2020   HDL 47 09/25/2020   CHOLHDL 4.2 09/25/2020   VLDL 38 09/25/2020   LDLCALC 111 (H) 09/25/2020   LDLCALC 157 (H) 10/31/2016    Physical Findings: AIMS:  , ,  ,  ,    CIWA:    COWS:     Musculoskeletal: Strength & Muscle Tone: within normal limits Gait & Station: normal Patient leans: N/A  Psychiatric Specialty Exam:  Presentation  General Appearance: Appropriate  for Environment; Well Groomed  Eye Contact:Good  Speech:Clear and Coherent; Normal Rate  Speech Volume:Normal  Handedness:Right   Mood and Affect  Mood:Depressed ("not as depressed")  Affect:Constricted; Congruent   Thought Process  Thought Processes:Coherent; Linear; Goal Directed  Descriptions of Associations:Intact  Orientation:Full (Time, Place and Person)  Thought Content:Logical  History of Schizophrenia/Schizoaffective disorder:No  Duration of Psychotic Symptoms:No data recorded Hallucinations:Hallucinations: None  Ideas of Reference:None  Suicidal Thoughts:Suicidal Thoughts: Yes, Passive SI Active Intent and/or Plan: Without Intent; Without Plan SI Passive Intent and/or Plan: Without Intent; Without Plan  Homicidal Thoughts:Homicidal Thoughts: No   Sensorium  Memory:Immediate Good; Recent Good; Remote Good  Judgment:Fair  Insight:Fair   Executive Functions  Concentration:Good  Attention Span:Good  Recall:Good  Fund of Knowledge:Good  Language:Good   Psychomotor Activity  Psychomotor Activity:Psychomotor Activity: Normal   Assets  Assets:Communication Skills; Desire for Improvement; Financial Resources/Insurance; Housing; Physical Health; Resilience; Social Support; Vocational/Educational   Sleep  Sleep:Sleep: Fair    Physical Exam: Physical Exam Vitals and nursing note reviewed.  HENT:     Head: Normocephalic and atraumatic.  Pulmonary:     Effort: Pulmonary effort is normal.  Neurological:     General: No focal deficit present.     Mental Status: She is alert and oriented to person, place, and time.    Review of Systems  Constitutional: Positive for malaise/fatigue. Negative for diaphoresis and fever.  HENT: Positive for congestion. Negative for sore throat.        Positive for sinus congestion  Eyes: Negative for blurred vision.  Respiratory: Negative for cough and shortness of breath.   Cardiovascular: Negative for  chest pain and palpitations.  Gastrointestinal: Positive for nausea. Negative for constipation,  diarrhea and vomiting.       Positive for mild nausea  Genitourinary: Negative for dysuria.  Musculoskeletal: Negative for back pain, joint pain and myalgias.  Skin: Negative for rash.  Neurological: Negative for dizziness, tremors and headaches.  Endo/Heme/Allergies: Does not bruise/bleed easily.  Psychiatric/Behavioral: Positive for depression and suicidal ideas. Negative for hallucinations. The patient is not nervous/anxious.    Blood pressure 100/64, pulse 74, temperature 97.8 F (36.6 C), temperature source Oral, resp. rate 18, height 4\' 11"  (1.499 m), weight 71.7 kg, SpO2 100 %. Body mass index is 31.91 kg/m.   Treatment Plan Summary: The patient is a 55 year old female with major depressive disorder and a history of nonsuicidal self-injurious behavior who was admitted following a suicide attempt for treatment of symptoms of major depression.  Suspect cluster B traits possibly contributing to symptoms and presentation.  Patient requires continued inpatient hospitalization for further stabilization and treatment of depression.  Daily contact with patient to assess and evaluate symptoms and progress in treatment and Medication management   Continue on IVC   Continue every 15-minute observation level for safety of patient  Depression/anxiety -Continue Wellbutrin XL 300 mg daily -Discontinued BuSpar due to unclear effect.  Patient has been taking since November.  Family members have taken BuSpar and have reported adverse effect on mood and anxiety symptoms. -Continue sertraline 50 mg daily (started 09/26/2020) -Continue hydroxyzine 25 mg 3 times daily as needed for anxiety  Insomnia -Start melatonin 3 mg nightly standing dose -Derease as needed trazodone to 25 mg nightly as needed for insomnia  Seasonal allergies -Continue Flonase, Claritin  Hypothyroidism -TSH WNL -Continue  Synthroid 50 mcg daily  GERD -Continue Protonix 40 mg daily  Patient would like to go to residential treatment program for depression after discharge from inpatient hospitalization.  Social work is working on 09/28/2020.  I certify that inpatient services furnished can reasonably be expected to improve the patient's condition.  Public house manager, MD 09/27/2020, 4:23 PM

## 2020-09-27 NOTE — [facility] Counselor (Signed)
CSW faxxed referral to [facility] and left a voicemail for admissions.   Fredirick Lathe, LCSWA Clinicial Social Worker [employer]

## 2020-09-28 NOTE — [facility] Counselor (Signed)
CSW spoke with pt about waitlist for [facility]. Pt was upset with this news and when CSW began to speak about how pt cannot return to her home in [facility]. CSW reminded pt how pt had told CSW on Wednesday that her sister had informed her(the pt) of this and that is why she was sad. Pt stated she was upset by this. CSW invited pt to the Social Work group. Pt came to group late and after introducing herself stormed out. CSW went and saw pt in her room to do a check in after pt stormed out of group. CSW consulted pt about being upset about her family not letting her return home post-discharge.    CSW received the following email from pt's sister, Ventura Sellers:  Good afternoon Denny Peon, Since our last conversation, I have tried twice to reach an admissions counselor at [facility]. Today I spoke to Matthew Saras at the front desk and she said I am to wait until an admissions counselor gets in touch with me. She also mentioned that she could give me no indication of when I can expect a call from a counselor and that the current waiting period is 3-4 weeks. I did try to ring you back yesterday but couldn't catch you in your office so I am popping you this email to ask a couple of questions and ask a couple as well.  -We are filling in the Insurance Verification Form and will send that plus a copy of the front and back of the insurance card to the [facility] admissions office as requested by their website. Do you want a copy of the form as well as part of your referral packet?  -There are photos attached of the front and back of the insurance card as requested.  [place] is required to have a valid Covid test prior to admittance. Definitely premature but do you have a suggestion how we can do that smoothly without having to delay her admission?  -What is the best course of action if [facility] cannot be admitted soon, how long is she able to stay at your facility? Or do we have to think of an intermittent facility? Our biggest  concern is that she not have to go home to [facility] before she continues her wellness care. Hope to speak to you today before you head out for the weekend. Kind regards, Ventura Sellers  While drafting a response to the email, CSW received a call from Ms. Bullard. CSW and Ms. Bullard discussed pt being upset about not being able to return home. CSW confirmed that pt was informed that she could not return to the home in [facility] when they had spoke on Wednesday. CSW advised Ms. Bullard of the 3-4 week wait list for [facility] and informed her that pt would need somewhere to stay during this waiting period. Ms. Larence Penning stated that she is going to talk with the pt about coming to [place] and staying with her until she is able to go to [facility]. Ms. Larence Penning stated that she would inform CSW of pt's decision on this matter on Monday when CSW returned. CSW requested that Ms. Bullard have the pt tell the weekend CSW of her decision.

## 2020-09-28 NOTE — Progress Notes (Addendum)
D:  Patient denied SI and HI, contracts for safety.  Denied A/V hallucinations.  Denied pain. A:  Medications administered per MD orders.  Emotional support and encouragement given patient. R:  Safety maintained with 15 minute checks.  Patient's self inventory sheet, patient sleeps good, sleep medication helpful.  Good appetite, normal energy level,   Good concentration.  Denied depression, hopeless and anxiety.  Denied withdrawals.  Denied SI.  Denied physical problems.  Denied physical pain.  Goal is moving forward.  Plans to keep thinking positive thoughts.  Does have discharge plans. Marland Kitchen

## 2020-09-28 NOTE — Progress Notes (Addendum)
Pt stated she was feeling better due to not wanting to hit herself, pt stated she was on wait list for residential Tx facility. Pt took PRN Trazodone and Vistaril per MAR with HS medication    09/28/20 2200  Psych Admission Type (Psych Patients Only)  Admission Status Involuntary  Psychosocial Assessment  Patient Complaints Anxiety  Eye Contact Brief  Facial Expression Sad;Sullen  Affect Appropriate to circumstance  Speech Logical/coherent  Interaction Assertive  Motor Activity Other (Comment) (WDL)  Appearance/Hygiene Unremarkable  Behavior Characteristics Anxious  Mood Anxious;Depressed  Thought Process  Coherency WDL  Content WDL  Delusions None reported or observed  Perception WDL  Hallucination None reported or observed  Judgment WDL  Confusion None  Danger to Self  Current suicidal ideation? Denies  Danger to Others  Danger to Others None reported or observed

## 2020-09-28 NOTE — Progress Notes (Signed)
[facility]  09/28/2020 5:05 PM Hannah [name]  MRN:  [id number] Subjective: Patient seen and interviewed.  Medical record reviewed.  Patient's case discussed in detail with members of the treatment team.  On interview today, the patient states her mood is a little better.  She rates her depression as 3-4 out of 10 in intensity.  She feels a bit sad since some peers with whom she was friendly on the unit have been discharged.  The patient denies any passive wishes for death.  She denies any suicidal ideation intent preparation or plan.  She denies any fleeting images of harming herself.  The patient denies anxiety.  She reports that she is sleeping and eating well.  Her concentration is good and she has read for books while in the hospital.  She denies any medication side effects and states that she did not have grogginess this morning after changing her sleep medication.  She denies any physical problems.   Objective: Per chart review and nursing staff report, the patient has been taking standing dose medications as prescribed.  She has been attending groups.  Staff report that patient became upset during group earlier today and left abruptly.  Her vital signs are within normal limits.  The patient has been taking medications as prescribed.  She took as needed Atarax last night and as needed trazodone for sleep.  She has taken no other as needed medications.  Principal Problem: MDD (major depressive disorder), recurrent severe, without psychosis (HCC) Diagnosis: Principal Problem:   MDD (major depressive disorder), recurrent severe, without psychosis (HCC) Active Problems:   Anxiety disorder   Suicide attempt (HCC)  Total Time spent with patient: 20 minutes  Past Psychiatric History: See H&P.     Past Medical History:  Past Medical History:  Diagnosis Date  . Allergy   . Anxiety   . Asthma   . Depression   . GERD (gastroesophageal reflux disease)   . Migraine     Past Surgical  History:  Procedure Laterality Date  . ABDOMINAL HYSTERECTOMY  2004  . APPENDECTOMY  2004  . CERVICAL CONIZATION W/BX  11/1995  . CESAREAN SECTION      x2  . laposcophy  05/1996  . TONSILLECTOMY    . TUBAL LIGATION    . WISDOM TOOTH EXTRACTION     x 4   Family History:  Family History  Adopted: Yes  Problem Relation Age of Onset  . Arthritis Mother   . Depression Mother   . Hypertension Mother   . Heart disease Mother        CHF  . Kidney disease Mother   . Diabetes Father   . Depression Sister   . Cancer Brother        colon, lungs, liver  . Diabetes Paternal Grandmother    Family Psychiatric  History: See H&P Social History:  Social History   Substance and Sexual Activity  Alcohol Use Yes   Comment: occasion "rare"     Social History   Substance and Sexual Activity  Drug Use No    Social History   Socioeconomic History  . Marital status: Married    Spouse name: Not on file  . Number of children: Not on file  . Years of education: Not on file  . Highest education level: Not on file  Occupational History  . Not on file  Tobacco Use  . Smoking status: Former Smoker    Packs/day: 1.00  Years: 9.00    Pack years: 9.00    Types: Cigarettes    Quit date: 12/28/1990    Years since quitting: 29.7  . Smokeless tobacco: Never Used  . Tobacco comment: quit 1992  Vaping Use  . Vaping Use: Every day  Substance and Sexual Activity  . Alcohol use: Yes    Comment: occasion "rare"  . Drug use: No  . Sexual activity: Not Currently    Partners: Male    Birth control/protection: Surgical  Other Topics Concern  . Not on file  Social History Narrative  . Not on file   Social Determinants of Health   Financial Resource Strain: Not on file  Food Insecurity: Not on file  Transportation Needs: Not on file  Physical Activity: Not on file  Stress: Not on file  Social Connections: Not on file   Additional Social History:                          Sleep: Fair  Appetite:  Fair  Current Medications: Current Facility-Administered Medications  Medication Dose Route Frequency Provider Last Rate Last Admin  . acetaminophen (TYLENOL) tablet 650 mg  650 mg Oral Q6H PRN Laveda Abbe, NP   650 mg at 09/24/20 2123  . alum & mag hydroxide-simeth (MAALOX/MYLANTA) 200-200-20 MG/5ML suspension 30 mL  30 mL Oral Q4H PRN Laveda Abbe, NP      . buPROPion (WELLBUTRIN XL) 24 hr tablet 300 mg  300 mg Oral Daily Laveda Abbe, NP   300 mg at 09/28/20 0744  . fluticasone (FLONASE) 50 MCG/ACT nasal spray 1 spray  1 spray Each Nare Daily Laveda Abbe, NP   1 spray at 09/28/20 4034  . hydrOXYzine (ATARAX/VISTARIL) tablet 25 mg  25 mg Oral TID PRN Laveda Abbe, NP   25 mg at 09/27/20 2106  . levothyroxine (SYNTHROID) tablet 50 mcg  50 mcg Oral Q0600 Laveda Abbe, NP   50 mcg at 09/28/20 0617  . loratadine (CLARITIN) tablet 10 mg  10 mg Oral Daily Laveda Abbe, NP   10 mg at 09/28/20 0744  . magnesium hydroxide (MILK OF MAGNESIA) suspension 30 mL  30 mL Oral Daily PRN Laveda Abbe, NP      . melatonin tablet 3 mg  3 mg Oral QHS Claudie Revering, MD   3 mg at 09/27/20 2108  . pantoprazole (PROTONIX) EC tablet 40 mg  40 mg Oral Daily Claudie Revering, MD   40 mg at 09/28/20 0744  . sertraline (ZOLOFT) tablet 50 mg  50 mg Oral Daily Claudie Revering, MD   50 mg at 09/28/20 0744  . traZODone (DESYREL) tablet 25 mg  25 mg Oral QHS PRN Claudie Revering, MD   25 mg at 09/27/20 2105    Lab Results: No results found for this or any previous visit (from the past 48 hour(s)).  Blood Alcohol level:  Lab Results  Component Value Date   ETH 148 (H) 09/22/2020    Metabolic Disorder Labs: Lab Results  Component Value Date   HGBA1C 5.3 09/25/2020   MPG 105.41 09/25/2020   No results found for: PROLACTIN Lab Results  Component Value Date   CHOL 196 09/25/2020   TRIG 189 (H) 09/25/2020   HDL 47  09/25/2020   CHOLHDL 4.2 09/25/2020   VLDL 38 09/25/2020   LDLCALC 111 (H) 09/25/2020   LDLCALC 157 (H) 10/31/2016  Physical Findings: AIMS:  , ,  ,  ,    CIWA:    COWS:     Musculoskeletal: Strength & Muscle Tone: within normal limits Gait & Station: normal Patient leans: N/A  Psychiatric Specialty Exam:  Presentation  General Appearance: Appropriate for Environment; Well Groomed  Eye Contact:Good  Speech:Clear and Coherent; Normal Rate  Speech Volume:Normal  Handedness:Right   Mood and Affect  Mood:Depressed ("not as depressed")  Affect:Constricted; Congruent   Thought Process  Thought Processes:Coherent; Linear; Goal Directed  Descriptions of Associations:Intact  Orientation:Full (Time, Place and Person)  Thought Content:Logical  History of Schizophrenia/Schizoaffective disorder:No  Duration of Psychotic Symptoms:No data recorded Hallucinations:Hallucinations: None  Ideas of Reference:None  Suicidal Thoughts:Suicidal Thoughts: Yes, Passive SI Active Intent and/or Plan: Without Intent; Without Plan SI Passive Intent and/or Plan: Without Intent; Without Plan  Homicidal Thoughts:Homicidal Thoughts: No   Sensorium  Memory:Immediate Good; Recent Good; Remote Good  Judgment:Fair  Insight:Fair   Executive Functions  Concentration:Good  Attention Span:Good  Recall:Good  Fund of Knowledge:Good  Language:Good   Psychomotor Activity  Psychomotor Activity:Psychomotor Activity: Normal   Assets  Assets:Communication Skills; Desire for Improvement; Financial Resources/Insurance; Housing; Physical Health; Resilience; Social Support; Vocational/Educational   Sleep  Sleep:Sleep: Fair    Physical Exam: Physical Exam Vitals and nursing note reviewed.  HENT:     Head: Normocephalic and atraumatic.  Pulmonary:     Effort: Pulmonary effort is normal.  Neurological:     General: No focal deficit present.     Mental Status: She is  alert and oriented to person, place, and time.    Review of Systems  Constitutional: Negative for diaphoresis and fever.  HENT: Negative for sore throat.        Positive for sinus congestion  Eyes: Negative for blurred vision.  Respiratory: Negative for cough and shortness of breath.   Cardiovascular: Negative for chest pain and palpitations.  Gastrointestinal: Negative for constipation, diarrhea, nausea and vomiting.       Positive for mild nausea  Genitourinary: Negative for dysuria.  Musculoskeletal: Negative for back pain, joint pain and myalgias.  Skin: Negative for rash.  Neurological: Negative for dizziness, tremors and headaches.  Endo/Heme/Allergies: Does not bruise/bleed easily.  Psychiatric/Behavioral: Positive for depression. Negative for hallucinations and suicidal ideas. The patient is not nervous/anxious.    Blood pressure 104/68, pulse 70, temperature 97.8 F (36.6 C), temperature source Oral, resp. rate 16, height 4\' 11"  (1.499 m), weight 71.7 kg, SpO2 98 %. Body mass index is 31.91 kg/m.   Treatment Plan Summary: The patient is a 55 year old female with major depressive disorder and a history of nonsuicidal self-injurious behavior who was admitted following a suicide attempt for treatment of symptoms of major depression.  Suspect cluster B traits possibly contributing to symptoms and presentation.  Patient requires continued inpatient hospitalization for further stabilization and treatment of depression and for finalization of aftercare plans.  Daily contact with patient to assess and evaluate symptoms and progress in treatment and Medication management   Continue on IVC   Continue every 15-minute observation level for safety of patient  Depression/anxiety -Continue Wellbutrin XL 300 mg daily -Continue sertraline 50 mg daily (started 09/26/2020) -Continue hydroxyzine 25 mg 3 times daily as needed for anxiety  Insomnia -Continue melatonin 3 mg nightly standing  dose -Continue trazodone to 25 mg nightly as needed for insomnia  Seasonal allergies -Continue Flonase, Claritin  Hypothyroidism -TSH WNL -Continue Synthroid 50 mcg daily  GERD -Continue Protonix 40 mg daily  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  Patient wanted to go to residential program after discharge but there is currently a waiting list of several weeks to get into residential program.  Social work is attempting to clarify housing plans after discharge and set up partial hospital program.   I certify that inpatient services furnished can reasonably be expected to improve the patient's condition.  Claudie Revering, MD 09/28/2020, 5:05 PM

## 2020-09-28 NOTE — Progress Notes (Signed)
Recreation Therapy Notes  Date: 4.1.22 Time: 0930 Location: [address] Dayroom  Group Topic: Stress Management  Goal Area(s) Addresses:  Patient will actively participate in Anger management techniques presented during session.  Patient will successfully identify benefit of practicing Anger management post d/c.   Behavioral Response: Engaged  Intervention: Stress management techniques  Activity : Progressive Muscle Relaxation  LRT provided education, instruction and demonstration on practice of Progressive Muscle Relaxation. Patient was asked to participate in technique introduced during session. Patient were to follow along as LRT read the script to engage in activity.  Education: Stress Management, Discharge Planning.   Education Outcome: Acknowledges education/In group clarification offered  Clinical Observations/Feedback: Patient attended and participated in group session.    Caroll Rancher, LRT/CTRS     Lillia Abed, Shernell Saldierna A 09/28/2020 11:03 AM

## 2020-09-28 NOTE — [facility] Group Notes (Signed)
Type of Therapy:  Group Therapy: Gratitude   Participation Level:  Minimal  Summary of Progress/Problems: Pt came to group late, shared her name and stated she was not grateful for anything and stormed out.   Fredirick Lathe, LCSWA Clinicial Social Worker [employer]

## 2020-09-28 NOTE — Plan of Care (Signed)
Nurse discussed anxiety, depression and coping skills with patient.  

## 2020-09-28 NOTE — [facility] Counselor (Signed)
CSW learned that there is a 3-4 week waitlist for [facility].   Fredirick Lathe, LCSWA Clinicial Social Worker [employer]

## 2020-09-28 NOTE — [facility] Group Notes (Signed)
Adult Psychoeducational Group Note  Date:  09/28/2020 Time:  10:44 AM  Group Topic/Focus:  Managing Feelings:   The focus of this group is to identify what feelings patients have difficulty handling and develop a plan to handle them in a healthier way upon discharge.  Participation Level:  Active  Participation Quality:  Appropriate and Attentive  Affect:  Appropriate  Cognitive:  Alert and Appropriate  Insight: Appropriate and Good  Engagement in Group:  Engaged  Modes of Intervention:  Discussion  Additional Comments:  Pt attended and participated in morning goals group.   Deforest Hoyles Damia Bobrowski 09/28/2020, 10:44 AM

## 2020-09-29 DIAGNOSIS — F332 Major depressive disorder, recurrent severe without psychotic features: Secondary | ICD-10-CM | POA: Diagnosis not present

## 2020-09-29 NOTE — Progress Notes (Signed)
   09/29/20 0500  Sleep  Number of Hours 6.75   

## 2020-09-29 NOTE — Progress Notes (Signed)
[facility] Group Notes:  (Nursing/MHT/Case Management/Adjunct)  Date:  09/29/2020  Time:  2015 Type of Therapy:  wrap up group  Participation Level:  Active  Participation Quality:  Appropriate, Attentive, Sharing and Supportive  Affect:  Appropriate  Cognitive:  Appropriate  Insight:  Improving  Engagement in Group:  Engaged  Modes of Intervention:  Clarification, Education and Support  Summary of Progress/Problems: Positive thinking and self-care were discussed.   Marcille Buffy 09/29/2020, 10:38 PM

## 2020-09-29 NOTE — Progress Notes (Signed)
[facility] MD Progress Note  09/29/2020 3:48 PM Hannah [name]  MRN:  [id number]   Subjective: "I feel better today, this is the best day so far"   Objective: Patient was seen, chart reviewed and case discussed with the treatment team.Patient has been taking medications without issues. She described feeling a little nauseous in the afternoon since startting  Zoloft. Reassurance provided that this will pass and reminded patient to ask for a snack or something to drink to help with this symptom. She has been attending groups and getting along well with staff and peers. She stated she is apprehensive about leaving and is unsure where she is going from here. She may go to [place] for a few weeks to be with her sister until a bed at the residential center near [place] opens up. Patient stated she feels like she is being entitled when she considers going to [place]. Asked patient to re-frame these thoughts as her needing to take time for her own mental health. Patient agrees to try.  Her vital signs are within normal limits.  She took as needed Atarax last night and as needed trazodone for sleep.  She denies any suicidal ideation intent preparation or plan.  She denies any fleeting images of harming herself.  The patient denies anxiety.  She reports that she is sleeping and eating well.  Her concentration is good and she has read for books while in the hospital. Ongoing support and encouragement provided.   Principal Problem: MDD (major depressive disorder), recurrent severe, without psychosis (HCC) Diagnosis: Principal Problem:   MDD (major depressive disorder), recurrent severe, without psychosis (HCC) Active Problems:   Anxiety disorder   Suicide attempt (HCC)  Total Time spent with patient: 20 minutes  Past Psychiatric History: See H&P.     Past Medical History:  Past Medical History:  Diagnosis Date  . Allergy   . Anxiety   . Asthma   . Depression   . GERD (gastroesophageal reflux disease)   .  Migraine     Past Surgical History:  Procedure Laterality Date  . ABDOMINAL HYSTERECTOMY  2004  . APPENDECTOMY  2004  . CERVICAL CONIZATION W/BX  11/1995  . CESAREAN SECTION      x2  . laposcophy  05/1996  . TONSILLECTOMY    . TUBAL LIGATION    . WISDOM TOOTH EXTRACTION     x 4   Family History:  Family History  Adopted: Yes  Problem Relation Age of Onset  . Arthritis Mother   . Depression Mother   . Hypertension Mother   . Heart disease Mother        CHF  . Kidney disease Mother   . Diabetes Father   . Depression Sister   . Cancer Brother        colon, lungs, liver  . Diabetes Paternal Grandmother    Family Psychiatric  History: See H&P Social History:  Social History   Substance and Sexual Activity  Alcohol Use Yes   Comment: occasion "rare"     Social History   Substance and Sexual Activity  Drug Use No    Social History   Socioeconomic History  . Marital status: Married    Spouse name: Not on file  . Number of children: Not on file  . Years of education: Not on file  . Highest education level: Not on file  Occupational History  . Not on file  Tobacco Use  . Smoking status: Former Smoker  Packs/day: 1.00    Years: 9.00    Pack years: 9.00    Types: Cigarettes    Quit date: 12/28/1990    Years since quitting: 29.7  . Smokeless tobacco: Never Used  . Tobacco comment: quit 1992  Vaping Use  . Vaping Use: Every day  Substance and Sexual Activity  . Alcohol use: Yes    Comment: occasion "rare"  . Drug use: No  . Sexual activity: Not Currently    Partners: Male    Birth control/protection: Surgical  Other Topics Concern  . Not on file  Social History Narrative  . Not on file   Social Determinants of Health   Financial Resource Strain: Not on file  Food Insecurity: Not on file  Transportation Needs: Not on file  Physical Activity: Not on file  Stress: Not on file  Social Connections: Not on file   Additional Social History:      Sleep: Good  Appetite:  Fair  Current Medications: Current Facility-Administered Medications  Medication Dose Route Frequency Provider Last Rate Last Admin  . acetaminophen (TYLENOL) tablet 650 mg  650 mg Oral Q6H PRN Laveda AbbeParks, Egon Dittus Britton, NP   650 mg at 09/24/20 2123  . alum & mag hydroxide-simeth (MAALOX/MYLANTA) 200-200-20 MG/5ML suspension 30 mL  30 mL Oral Q4H PRN Laveda AbbeParks, Joban Colledge Britton, NP      . buPROPion (WELLBUTRIN XL) 24 hr tablet 300 mg  300 mg Oral Daily Laveda AbbeParks, Baer Hinton Britton, NP   300 mg at 09/29/20 0819  . fluticasone (FLONASE) 50 MCG/ACT nasal spray 1 spray  1 spray Each Nare Daily Laveda AbbeParks, Donnarae Rae Britton, NP   1 spray at 09/29/20 [id number]  . hydrOXYzine (ATARAX/VISTARIL) tablet 25 mg  25 mg Oral TID PRN Laveda AbbeParks, Sipriano Fendley Britton, NP   25 mg at 09/28/20 2137  . levothyroxine (SYNTHROID) tablet 50 mcg  50 mcg Oral Q0600 Laveda AbbeParks, Charli Liberatore Britton, NP   50 mcg at 09/29/20 0631  . loratadine (CLARITIN) tablet 10 mg  10 mg Oral Daily Laveda AbbeParks, Jaevian Shean Britton, NP   10 mg at 09/29/20 [id number]  . magnesium hydroxide (MILK OF MAGNESIA) suspension 30 mL  30 mL Oral Daily PRN Laveda AbbeParks, Skyelyn Scruggs Britton, NP      . melatonin tablet 3 mg  3 mg Oral QHS Claudie ReveringJames, Martha L, MD   3 mg at 09/28/20 2137  . pantoprazole (PROTONIX) EC tablet 40 mg  40 mg Oral Daily Claudie ReveringJames, Martha L, MD   40 mg at 09/29/20 0819  . sertraline (ZOLOFT) tablet 50 mg  50 mg Oral Daily Claudie ReveringJames, Martha L, MD   50 mg at 09/29/20 0820  . traZODone (DESYREL) tablet 25 mg  25 mg Oral QHS PRN Claudie ReveringJames, Martha L, MD   25 mg at 09/28/20 2137    Lab Results: No results found for this or any previous visit (from the past 48 hour(s)).  Blood Alcohol level:  Lab Results  Component Value Date   ETH 148 (H) 09/22/2020    Metabolic Disorder Labs: Lab Results  Component Value Date   HGBA1C 5.3 09/25/2020   MPG 105.41 09/25/2020   No results found for: PROLACTIN Lab Results  Component Value Date   CHOL 196 09/25/2020   TRIG 189 (H) 09/25/2020   HDL 47  09/25/2020   CHOLHDL 4.2 09/25/2020   VLDL 38 09/25/2020   LDLCALC 111 (H) 09/25/2020   LDLCALC 157 (H) 10/31/2016    Physical Findings: AIMS:  , ,  ,  ,    CIWA:  COWS:     Musculoskeletal: Strength & Muscle Tone: within normal limits Gait & Station: normal Patient leans: N/A  Psychiatric Specialty Exam:  Presentation  General Appearance: Appropriate for Environment; Well Groomed  Eye Contact:Good  Speech:Clear and Coherent; Normal Rate  Speech Volume:Normal  Handedness:Right   Mood and Affect  Mood:Depressed ("not as depressed")  Affect:Constricted; Congruent   Thought Process  Thought Processes:Coherent; Linear; Goal Directed  Descriptions of Associations:Intact  Orientation:Full (Time, Place and Person)  Thought Content:Logical  History of Schizophrenia/Schizoaffective disorder:No  Duration of Psychotic Symptoms:No data recorded Hallucinations:No data recorded  Ideas of Reference:None  Suicidal Thoughts:No data recorded  Homicidal Thoughts:No data recorded   Sensorium  Memory:Immediate Good; Recent Good; Remote Good  Judgment:Fair  Insight:Fair   Executive Functions  Concentration:Good  Attention Span:Good  Recall:Good  Fund of Knowledge:Good  Language:Good   Psychomotor Activity  Psychomotor Activity:No data recorded   Assets  Assets:Communication Skills; Desire for Improvement; Financial Resources/Insurance; Housing; Physical Health; Resilience; Social Support; Vocational/Educational   Sleep  Sleep:No data recorded    Physical Exam: Physical Exam Vitals and nursing note reviewed.  HENT:     Head: Normocephalic and atraumatic.  Pulmonary:     Effort: Pulmonary effort is normal.  Neurological:     General: No focal deficit present.     Mental Status: She is alert and oriented to person, place, and time.    Review of Systems  Constitutional: Negative for diaphoresis and fever.  HENT: Negative for sore  throat.        Positive for sinus congestion  Eyes: Negative for blurred vision.  Respiratory: Negative for cough and shortness of breath.   Cardiovascular: Negative for chest pain and palpitations.  Gastrointestinal: Negative for constipation, diarrhea, nausea and vomiting.       Positive for mild nausea  Genitourinary: Negative for dysuria.  Musculoskeletal: Negative for back pain, joint pain and myalgias.  Skin: Negative for rash.  Neurological: Negative for dizziness, tremors and headaches.  Endo/Heme/Allergies: Does not bruise/bleed easily.  Psychiatric/Behavioral: Positive for depression. Negative for hallucinations and suicidal ideas. The patient is not nervous/anxious.    Blood pressure 103/61, pulse 66, temperature 98 F (36.7 C), temperature source Oral, resp. rate 16, height 4\' 11"  (1.499 m), weight 71.7 kg, SpO2 100 %. Body mass index is 31.91 kg/m.   Treatment Plan Summary: The patient is a 55 year old female with major depressive disorder and a history of nonsuicidal self-injurious behavior who was admitted following a suicide attempt for treatment of symptoms of major depression.  Suspect cluster B traits possibly contributing to symptoms and presentation.  Patient requires continued inpatient hospitalization for further stabilization and treatment of depression and for finalization of aftercare plans.  Daily contact with patient to assess and evaluate symptoms and progress in treatment and Medication management   Continue on IVC   Continue every 15-minute observation level for safety of patient  Depression/anxiety -Continue Wellbutrin XL 300 mg daily -Continue sertraline 50 mg daily (started 09/26/2020) -Continue hydroxyzine 25 mg 3 times daily as needed for anxiety  Insomnia -Continue melatonin 3 mg nightly standing dose -Continue trazodone to 25 mg nightly as needed for insomnia  Seasonal allergies -Continue Flonase, Claritin  Hypothyroidism -TSH  WNL -Continue Synthroid 50 mcg daily  GERD -Continue Protonix 40 mg daily  Social work is working on 09/28/2020.  Patient wanted to go to residential program after discharge but there is currently a waiting list of several weeks to get into residential program.  Social  work is attempting to clarify housing plans after discharge and set up partial hospital program.     Laveda Abbe, NP 09/29/2020, 3:48 PM

## 2020-09-29 NOTE — [facility] Group Notes (Signed)
LCSW Group Therapy Note  09/29/2020    10:00-11:00am   Topic:  Anger Healthy and Unhealthy Coping Skills  Participation Level:  Active  Description of Group:   In this group, patients identified their own common triggers and typical reactions then analyzed how these reactions are possibly beneficial and possibly unhelpful.  Focus was placed on examining whether typical coping skills are healthy or unhealthy.  Therapeutic Goals: 1. Patients will share situations that commonly incite their anger and how they typically respond 2. Patients will identify how their coping skills work for them and/or against them 3. Patients will explore possible alternative coping skills 4. Patients will learn that anger itself is normal and that healthier reactions can assist with resolving conflict rather than worsening situations  Summary of Patient Progress:  The patient shared that her frequent cause anger is when she has no control, which she reported as the situation she has been continually faced with for the last 3 months since her husband decided to terminate the marriage unilaterally.  Choice of coping skill is often beating herself (at times with her fists and at times with a hammer), which was identified as an unhealthy choice for obvious reasons.  She was very interactive and insightful with other patients.  Therapeutic Modalities:   Cognitive Behavioral Therapy Processing  Lynnell Chad

## 2020-09-29 NOTE — [facility] Group Notes (Signed)
.  Psychoeducational Group Note  Date: 09-29-2020 Time: 0900-1000    Goal Setting   Purpose of Group: This group helps to provide patients with the steps of setting a goal that is specific, measurable, attainable, realistic and time specific. A discussion on how we keep ourselves stuck with negative self talk.    Participation Level:  Active  Participation Quality:  Appropriate  Affect:  Appropriate  Cognitive:  Appropriate  Insight:  Improving  Engagement in Group:  Engaged  Additional Comments:  Pt rates her energy as a 7.5/10. Shared in the group and was given homework to write 30 positives about herself.  Dione Housekeeper

## 2020-09-29 NOTE — Progress Notes (Signed)
Pt denies SI/HI/AVH.  Pt attends groups and supports other pt's on the unit.  Pt is calm and denies feeling anxious or depressed.  Pt remains safe on the unit with q 15 min checks in place.

## 2020-09-29 NOTE — Progress Notes (Signed)
   09/29/20 2236  Psych Admission Type (Psych Patients Only)  Admission Status Involuntary  Psychosocial Assessment  Patient Complaints Insomnia  Eye Contact Brief  Facial Expression Flat  Affect Appropriate to circumstance  Speech Logical/coherent  Interaction Assertive  Motor Activity Other (Comment) (WDL)  Appearance/Hygiene Unremarkable  Behavior Characteristics Appropriate to situation  Mood Anxious  Thought Process  Coherency WDL  Content WDL  Delusions None reported or observed  Perception WDL  Hallucination None reported or observed  Judgment WDL  Confusion None  Danger to Self  Current suicidal ideation? Denies  Danger to Others  Danger to Others None reported or observed

## 2020-09-30 DIAGNOSIS — F332 Major depressive disorder, recurrent severe without psychotic features: Secondary | ICD-10-CM | POA: Diagnosis not present

## 2020-09-30 NOTE — [facility] Group Notes (Signed)
[facility] LCSW Group Therapy Note  09/30/2020    Type of Therapy and Topic:  Group Therapy:  Adding Supports Including Yourself  Participation Level:  Active   Description of Group:   Patients in this group were introduced to the concept that additional supports including self-support are an essential part of recovery.  Patients listed their current healthy and unhealthy supports, and discussed the difference between the two.   Several songs were played and a group discussion ensued in which patients stated they could relate to the songs which inspired them to realize they have be willing to help themselves in order to succeed, because other people cannot achieve sobriety or stability for them.  Parents were encouraged toward self-advocacy and self-support as part of their recovery.  They discussed their reactions to these songs' messages, which were positive and hopeful.  Before group ended, they identified the supports they believe they need to add to their lives to achieve their goals at discharge.   Therapeutic Goals: 1)  explain the difference between healthy and unhealthy supports and discuss what specific supports are currently in patients' lives 2)  demonstrate the importance of being a key part of one's own support system 3)  discuss the need for appropriate boundaries with supports 4)  elicit ideas from patients about supports that need to be added in order to achieve goals   Summary of Patient Progress:   The patient acknowledged that she hears the message to work on loving herself and understands what that means, but states she does not know what that would look like, as she cannot picture it.  CSW suggested to her that one step toward loving herself would be to decide to refrain from self-harm.  She feels good that she has not punched herself since arriving in the hospital 6 days ago and said her bruises are fading.  She also expressed appreciation that other patients told her things that they  like about her, but continued to state that she cannot actually accept that those things are true.  She gave encouragement to other patients.  She was given an assignment to write a letter to her younger self.  Therapeutic Modalities:   Motivational Interviewing Activity  Lynnell Chad

## 2020-09-30 NOTE — Progress Notes (Signed)
   09/30/20 2142  COVID-19 Daily Checkoff  Have you had a fever (temp > 37.80C/100F)  in the past 24 hours?  No  If you have had runny nose, nasal congestion, sneezing in the past 24 hours, has it worsened? No  COVID-19 EXPOSURE  Have you traveled outside the state in the past 14 days? No  Have you been in contact with someone with a confirmed diagnosis of COVID-19 or PUI in the past 14 days without wearing appropriate PPE? No  Have you been living in the same home as a person with confirmed diagnosis of COVID-19 or a PUI (household contact)? No  Have you been diagnosed with COVID-19? No

## 2020-09-30 NOTE — Progress Notes (Signed)
   09/30/20 2145  Psych Admission Type (Psych Patients Only)  Admission Status Involuntary  Psychosocial Assessment  Patient Complaints None  Eye Contact Fair  Facial Expression Flat  Affect Appropriate to circumstance  Speech Logical/coherent  Interaction Assertive  Motor Activity Other (Comment) (WDL)  Appearance/Hygiene Unremarkable  Behavior Characteristics Appropriate to situation  Mood Pleasant  Thought Process  Coherency WDL  Content WDL  Delusions None reported or observed  Perception WDL  Hallucination None reported or observed  Judgment WDL  Confusion None  Danger to Self  Current suicidal ideation? Denies  Danger to Others  Danger to Others None reported or observed

## 2020-09-30 NOTE — Progress Notes (Signed)
[facility] MD Progress Note  09/30/2020 1:43 PM Hannah [name]  MRN:  [id number]   Subjective: "I'm doing pretty good."   Patient was seen, chart reviewed and case discussed with the morning meeting team.  She denies depression and anxiety today, she knows immediately when she awakens if it is going to be a good or bad day.  The weekend has been "really good with good groups.  They kept [place] engaged, busy, and going outside."  Sleep is "good", appetite is fair.  No physical issues.  Reports afternoon nausea that started with the initiation of Zoloft, mild nausea.  Engaged easily in conversation and about her plan to go to [facility] or stay with her sister in [place] as she waits for an opening.  Actively participating in groups.  Principal Problem: MDD (major depressive disorder), recurrent severe, without psychosis (HCC) Diagnosis: Principal Problem:   MDD (major depressive disorder), recurrent severe, without psychosis (HCC) Active Problems:   Anxiety disorder   Suicide attempt (HCC)  Total Time spent with patient: 20 minutes  Past Psychiatric History: See H&P.     Past Medical History:  Past Medical History:  Diagnosis Date  . Allergy   . Anxiety   . Asthma   . Depression   . GERD (gastroesophageal reflux disease)   . Migraine     Past Surgical History:  Procedure Laterality Date  . ABDOMINAL HYSTERECTOMY  2004  . APPENDECTOMY  2004  . CERVICAL CONIZATION W/BX  11/1995  . CESAREAN SECTION      x2  . laposcophy  05/1996  . TONSILLECTOMY    . TUBAL LIGATION    . WISDOM TOOTH EXTRACTION     x 4   Family History:  Family History  Adopted: Yes  Problem Relation Age of Onset  . Arthritis Mother   . Depression Mother   . Hypertension Mother   . Heart disease Mother        CHF  . Kidney disease Mother   . Diabetes Father   . Depression Sister   . Cancer Brother        colon, lungs, liver  . Diabetes Paternal Grandmother    Family Psychiatric  History: See H&P Social  History:  Social History   Substance and Sexual Activity  Alcohol Use Yes   Comment: occasion "rare"     Social History   Substance and Sexual Activity  Drug Use No    Social History   Socioeconomic History  . Marital status: Married    Spouse name: Not on file  . Number of children: Not on file  . Years of education: Not on file  . Highest education level: Not on file  Occupational History  . Not on file  Tobacco Use  . Smoking status: Former Smoker    Packs/day: 1.00    Years: 9.00    Pack years: 9.00    Types: Cigarettes    Quit date: 12/28/1990    Years since quitting: 29.7  . Smokeless tobacco: Never Used  . Tobacco comment: quit 1992  Vaping Use  . Vaping Use: Every day  Substance and Sexual Activity  . Alcohol use: Yes    Comment: occasion "rare"  . Drug use: No  . Sexual activity: Not Currently    Partners: Male    Birth control/protection: Surgical  Other Topics Concern  . Not on file  Social History Narrative  . Not on file   Social Determinants of Health   Financial  Resource Strain: Not on file  Food Insecurity: Not on file  Transportation Needs: Not on file  Physical Activity: Not on file  Stress: Not on file  Social Connections: Not on file   Additional Social History:     Sleep: Good  Appetite:  Fair  Current Medications: Current Facility-Administered Medications  Medication Dose Route Frequency Provider Last Rate Last Admin  . acetaminophen (TYLENOL) tablet 650 mg  650 mg Oral Q6H PRN Laveda AbbeParks, Laurie Britton, NP   650 mg at 09/24/20 2123  . alum & mag hydroxide-simeth (MAALOX/MYLANTA) 200-200-20 MG/5ML suspension 30 mL  30 mL Oral Q4H PRN Laveda AbbeParks, Laurie Britton, NP      . buPROPion (WELLBUTRIN XL) 24 hr tablet 300 mg  300 mg Oral Daily Laveda AbbeParks, Laurie Britton, NP   300 mg at 09/30/20 0750  . fluticasone (FLONASE) 50 MCG/ACT nasal spray 1 spray  1 spray Each Nare Daily Laveda AbbeParks, Laurie Britton, NP   1 spray at 09/30/20 0749  . hydrOXYzine  (ATARAX/VISTARIL) tablet 25 mg  25 mg Oral TID PRN Laveda AbbeParks, Laurie Britton, NP   25 mg at 09/29/20 2154  . levothyroxine (SYNTHROID) tablet 50 mcg  50 mcg Oral Q0600 Laveda AbbeParks, Laurie Britton, NP   50 mcg at 09/30/20 [id number]  . loratadine (CLARITIN) tablet 10 mg  10 mg Oral Daily Laveda AbbeParks, Laurie Britton, NP   10 mg at 09/30/20 0750  . magnesium hydroxide (MILK OF MAGNESIA) suspension 30 mL  30 mL Oral Daily PRN Laveda AbbeParks, Laurie Britton, NP      . melatonin tablet 3 mg  3 mg Oral QHS Claudie ReveringJames, Martha L, MD   3 mg at 09/29/20 2154  . pantoprazole (PROTONIX) EC tablet 40 mg  40 mg Oral Daily Claudie ReveringJames, Martha L, MD   40 mg at 09/30/20 0750  . sertraline (ZOLOFT) tablet 50 mg  50 mg Oral Daily Claudie ReveringJames, Martha L, MD   50 mg at 09/30/20 0750  . traZODone (DESYREL) tablet 25 mg  25 mg Oral QHS PRN Claudie ReveringJames, Martha L, MD   25 mg at 09/29/20 2155    Lab Results: No results found for this or any previous visit (from the past 48 hour(s)).  Blood Alcohol level:  Lab Results  Component Value Date   ETH 148 (H) 09/22/2020    Metabolic Disorder Labs: Lab Results  Component Value Date   HGBA1C 5.3 09/25/2020   MPG 105.41 09/25/2020   No results found for: PROLACTIN Lab Results  Component Value Date   CHOL 196 09/25/2020   TRIG 189 (H) 09/25/2020   HDL 47 09/25/2020   CHOLHDL 4.2 09/25/2020   VLDL 38 09/25/2020   LDLCALC 111 (H) 09/25/2020   LDLCALC 157 (H) 10/31/2016    Physical Findings: AIMS: Facial and Oral Movements Muscles of Facial Expression: None, normal Lips and Perioral Area: None, normal Jaw: None, normal Tongue: None, normal,Extremity Movements Upper (arms, wrists, hands, fingers): None, normal Lower (legs, knees, ankles, toes): None, normal, Trunk Movements Neck, shoulders, hips: None, normal, Overall Severity Severity of abnormal movements (highest score from questions above): None, normal Incapacitation due to abnormal movements: None, normal Patient's awareness of abnormal movements (rate only  patient's report): No Awareness, Dental Status Current problems with teeth and/or dentures?: No Does patient usually wear dentures?: No  CIWA:    COWS:     Musculoskeletal: Strength & Muscle Tone: within normal limits Gait & Station: normal Patient leans: N/A  Psychiatric Specialty Exam:  Presentation  General Appearance: Appropriate for Environment; Well  Groomed  Eye Contact:Good  Speech:Clear and Coherent; Normal Rate  Speech Volume:Normal  Handedness:Right   Mood and Affect  Mood:Depressed ("not as depressed")  Affect:Constricted; Congruent   Thought Process  Thought Processes:Coherent; Linear; Goal Directed  Descriptions of Associations:Intact  Orientation:Full (Time, Place and Person)  Thought Content:Logical  History of Schizophrenia/Schizoaffective disorder:No  Duration of Psychotic Symptoms:None Hallucinations:None  Ideas of Reference: NOne  Suicidal Thoughts: None  Homicidal Thoughts: None  Sensorium  Memory:Immediate Good; Recent Good; Remote Good  Judgment:Fair  Insight:Fair   Executive Functions  Concentration:Good  Attention Span:Good  Recall:Good  Fund of Knowledge:Good  Language:Good   Psychomotor Activity  Psychomotor Activity: WDL  Assets  Assets:Communication Skills; Desire for Improvement; Financial Resources/Insurance; Housing; Physical Health; Resilience; Social Support; Vocational/Educational   Sleep  Sleep: Good   Physical Exam: Physical Exam Vitals and nursing note reviewed.  HENT:     Head: Normocephalic and atraumatic.     Nose: Nose normal.  Pulmonary:     Effort: Pulmonary effort is normal.  Musculoskeletal:        General: Normal range of motion.     Cervical back: Normal range of motion.  Neurological:     General: No focal deficit present.     Mental Status: She is alert and oriented to person, place, and time.  Psychiatric:        Attention and Perception: Attention and perception normal.         Mood and Affect: Mood and affect normal.        Speech: Speech normal.        Behavior: Behavior normal. Behavior is cooperative.        Thought Content: Thought content normal.        Cognition and Memory: Cognition and memory normal.        Judgment: Judgment is impulsive.    Review of Systems  Constitutional: Negative for diaphoresis and fever.  HENT: Negative for sore throat.        Positive for sinus congestion  Eyes: Negative for blurred vision.  Respiratory: Negative for cough and shortness of breath.   Cardiovascular: Negative for chest pain and palpitations.  Gastrointestinal: Negative for constipation, diarrhea, nausea and vomiting.       Positive for mild nausea  Genitourinary: Negative for dysuria.  Musculoskeletal: Negative for back pain, joint pain and myalgias.  Skin: Negative for rash.  Neurological: Negative for dizziness, tremors and headaches.  Endo/Heme/Allergies: Does not bruise/bleed easily.  Psychiatric/Behavioral: Negative for hallucinations and suicidal ideas. The patient is not nervous/anxious.   All other systems reviewed and are negative.  Blood pressure 99/61, pulse 79, temperature 97.8 F (36.6 C), temperature source Oral, resp. rate 18, height 4\' 11"  (1.499 m), weight 71.7 kg, SpO2 97 %. Body mass index is 31.91 kg/m.   Treatment Plan Summary: The patient is a 55 year old female with major depressive disorder and a history of nonsuicidal self-injurious behavior who was admitted following a suicide attempt for treatment of symptoms of major depression.  Suspect cluster B traits possibly contributing to symptoms and presentation.  Patient requires continued inpatient hospitalization for further stabilization and treatment of depression and for finalization of aftercare plans.  Daily contact with patient to assess and evaluate symptoms and progress in treatment and Medication management   Continue IVC   Continue every 15-minute observation level  for safety of patient  Depression/anxiety -Continue Wellbutrin XL 300 mg daily -Continue sertraline 50 mg daily (started 09/26/2020) -Continue hydroxyzine 25 mg 3 times  daily as needed for anxiety  Insomnia -Continue melatonin 3 mg nightly standing dose -Continue trazodone to 25 mg nightly as needed for insomnia  Seasonal allergies -Continue Flonase, Claritin  Hypothyroidism -TSH WNL -Continue Synthroid 50 mcg daily  GERD -Continue Protonix 40 mg daily  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  Patient wanted to go to residential program after discharge but there is currently a waiting list of several weeks to get into residential program.  Social work is attempting to clarify housing plans after discharge and set up partial hospital program.     Nanine Means, NP 09/30/2020, 1:43 PM

## 2020-09-30 NOTE — [facility] Group Notes (Signed)
Psychoeducational Group Note  Date: 09-30-20 Time:  1300  Group Topic/Focus:  Making Healthy Choices:   The focus of this group is to help patients identify negative/unhealthy choices they were using prior to admission and identify positive/healthier coping strategies to replace them upon discharge.In this group, patients started asking about the brain and how the brain works with and how the chemicals work for those who use substances, the pros and cons of saboxone.  Participation Level:  Active  Participation Quality:  Appropriate  Affect:  Appropriate  Cognitive:  Oriented  Insight:  Improving  Engagement in Group:  Engaged  Additional Comments:  Pt entered the group late, after seeing the doctor. Participated fully in the group. Provided feedback as well as shared  Hannah [name]

## 2020-09-30 NOTE — [facility] Group Notes (Signed)
Adult Psychoeducational Group Not Date:  09/30/2020 Time:  0900-1045 Group Topic/Focus: PROGRESSIVE RELAXATION. A group where deep breathing is taught and tensing and relaxation muscle groups is used. Imagery is used as well.  Pts are asked to imagine 3 pillars that hold them up when they are not able to hold themselves up.  Participation Level:  Active  Participation Quality:  Appropriate  Affect:  Appropriate  Cognitive:  Oriented  Insight: Improving  Engagement in Group:  Engaged  Modes of Intervention:  Activity, Discussion, Education, and Support  Additional Comments:  Pt rates her energy at a 7/10. Staes her three pillars are: her family, God and her knowledge.  Dione Housekeeper

## 2020-09-30 NOTE — Progress Notes (Signed)
Patient denies SI/HI. She denies depression and anxiety today. She denies alcohol withdrawal symptoms and stated that she rarely drinks alcohol.  She reports fair sleep at night. She reports having a fair appetite today. Her stated goal "figure out where I can go from here."   Orders reviewed. Vital signs reviewed. Verbal support provided. 15 minute checks performed for safety.   Patient compliant with treatment plan and denies any medication side effects.

## 2020-10-01 MED ORDER — SERTRALINE HCL 25 MG PO TABS
25.0000 mg | ORAL_TABLET | Freq: Once | ORAL | Status: AC
  Administered 2020-10-01: 25 mg via ORAL
  Filled 2020-10-01: qty 1

## 2020-10-01 MED ORDER — SERTRALINE HCL 25 MG PO TABS
75.0000 mg | ORAL_TABLET | Freq: Every day | ORAL | Status: DC
  Administered 2020-10-02: 75 mg via ORAL
  Filled 2020-10-01 (×3): qty 3

## 2020-10-01 NOTE — Progress Notes (Signed)
D: Patient presents with flat and sad affect but is appropriate at time of assessment. Patient reports having thoughts of hitting herself earlier in the day but did not act on it. Patient denies SI/HI at this time. Patient also denies AH/VH at this time. Patient contracts for safety.  A: Provided positive reinforcement and encouragement.  R: Patient cooperative and receptive to efforts. Patient remains safe on the unit.   10/01/20 2121  Psych Admission Type (Psych Patients Only)  Admission Status Involuntary  Psychosocial Assessment  Patient Complaints Depression;Sadness  Eye Contact Fair  Facial Expression Flat  Affect Appropriate to circumstance;Flat  Speech Logical/coherent  Interaction Assertive  Motor Activity Other (Comment) (WDL)  Appearance/Hygiene Unremarkable  Behavior Characteristics Cooperative;Appropriate to situation  Mood Depressed;Sad  Thought Process  Coherency WDL  Content WDL  Delusions WDL  Perception WDL  Hallucination None reported or observed  Judgment WDL  Confusion None  Danger to Self  Current suicidal ideation? Denies

## 2020-10-01 NOTE — Progress Notes (Signed)
Patient rated her day as an 8 out of 10 due to her mood. She states that she went to bed early last night due to having a bad phone call. Her goal for tomorrow is to get discharged.

## 2020-10-01 NOTE — Plan of Care (Signed)
Nurse discussed anxiety, depression and coping skills with patient.  

## 2020-10-01 NOTE — [facility] Counselor (Signed)
Pt stated that she is only interested in [facility] if there is no waitlist. Pt is not interested in [facility] at this time because "her kids will not be able to come visit her.". Pt states that her son will pick her up at discharge to pack and then pt will go to her father's home until she flys out to [place] where she will stay with her sister until [facility] has an opening. CSW notified pt's sister of this plan.    Fredirick Lathe, LCSWA Clinicial Social Worker [employer]

## 2020-10-01 NOTE — Progress Notes (Signed)
ADULT GRIEF GROUP NOTE:  Spiritual care group on grief and loss facilitated by chaplain Burnis Kingfisher MDiv, [facility]  Group Goal:  Support / Education around grief and loss  Members engage in facilitated group support and psycho-social education.  Group Description:  Following introductions and group rules, group members engaged in facilitated group dialog and support around topic of loss, with particular support around experiences of loss in their lives. Group Identified types of loss (relationships / self / things) and identified patterns, circumstances, and changes that precipitate losses. Reflected on thoughts / feelings around loss, normalized grief responses, and recognized variety in grief experience. Group noted Worden's four tasks of grief in discussion.  Group drew on Adlerian / Rogerian, narrative, MI,  Patient Progress: Pt engaged with group and shared openly. She asked questions about her own experience with emotions and shared that she can't always "hold onto" emotions for long.   Chaplain followed up with patient regarding her feelings around her divorce.  Pt shared that she feels that something is "wrong with her" that she doesn't feel anger towards her husband, but does feel anger towards her friend whom he is currently dating. Chaplain normalized that there is not a "right way" to grieve and encouraged her to be gentle with herself.    Chaplain Dyanne Carrel, [facility] Pager, [phone] 4:43 PM

## 2020-10-01 NOTE — Progress Notes (Signed)
D:  Patient's self inventory sheet, patient sleeps good, sleep medication helpful.  Fair appetite, low energy level, good concentration.  Rated depression 6, hopeless 3, denied anxiety.  Denied withdrawals. Denied SI.  Denied physical problems.  Denied physical pain.  Where will she go from [facility] ?   Does have discharge plans. A:  Medications administered per MD orders.  Emotional support and encouragement given patient. R:  Safety maintained with 15 minute checks.  Denied SI and HI, contracts for safety.  Denied A/V hallucinations.

## 2020-10-01 NOTE — Progress Notes (Signed)
Recreation Therapy Notes  Date:  4.4.22 Time: 0930 Location: [address] Dayroom  Group Topic: Stress Management  Goal Area(s) Addresses:  Patient will identify positive stress management techniques. Patient will identify benefits of using stress management post d/c.  Intervention: Stress Management  Activity: Meditation.  LRT played a meditation that focused on calming anxiety.  Patients were to listen and follow along with the meditation to help overcome anxious thoughts and physical symptoms    Education:  Stress Management, Discharge Planning.   Education Outcome: Acknowledges Education  Clinical Observations/Feedback: Pt did not attend group.      Arlenne Kimbley, LRT/CTRS         Clotiel Troop A 10/01/2020 11:05 AM 

## 2020-10-01 NOTE — [facility] Group Notes (Signed)
LCSW Group Therapy Note  10/01/2020   Type of Therapy and Topic:  Group Therapy - Healthy vs Unhealthy Coping Skills  Participation Level:  Active  Description of Group The focus of this group was to determine what unhealthy coping techniques typically are used by group members and what healthy coping techniques would be helpful in coping with various problems. Patients were guided in becoming aware of the differences between healthy and unhealthy coping techniques. Patients were asked to identify 2-3 healthy coping skills they would like to learn to use more effectively.  Therapeutic Goals 1. Patients learned that coping is what human beings do all day long to deal with various situations in their lives 2. Patients defined and discussed healthy vs unhealthy coping techniques 3. Patients identified their preferred coping techniques and identified whether these were healthy or unhealthy 4. Patients determined 2-3 healthy coping skills they would like to become more familiar with and use more often. 5. Patients provided support and ideas to each other   Summary of Patient Progress:  During group, pt shared that she journals as a coping skill. Pt was seen interacting appropriately with peers during leisure time in nature.    Therapeutic Modalities Cognitive Behavioral Therapy Motivational Interviewing  Chrys Racer 10/01/2020  1:38 PM

## 2020-10-01 NOTE — Tx Team (Signed)
Interdisciplinary Treatment and Diagnostic Plan Update  10/01/2020 Time of Session: 9:00am Hannah [name] MRN: [id number]  Principal Diagnosis: MDD (major depressive disorder), recurrent severe, without psychosis (HCC)  Secondary Diagnoses: Principal Problem:   MDD (major depressive disorder), recurrent severe, without psychosis (HCC) Active Problems:   Anxiety disorder   Suicide attempt (HCC)   Current Medications:  Current Facility-Administered Medications  Medication Dose Route Frequency Provider Last Rate Last Admin  . acetaminophen (TYLENOL) tablet 650 mg  650 mg Oral Q6H PRN Laveda Abbe, NP   650 mg at 09/24/20 2123  . alum & mag hydroxide-simeth (MAALOX/MYLANTA) 200-200-20 MG/5ML suspension 30 mL  30 mL Oral Q4H PRN Laveda Abbe, NP      . buPROPion (WELLBUTRIN XL) 24 hr tablet 300 mg  300 mg Oral Daily Laveda Abbe, NP   300 mg at 10/01/20 0756  . fluticasone (FLONASE) 50 MCG/ACT nasal spray 1 spray  1 spray Each Nare Daily Laveda Abbe, NP   1 spray at 10/01/20 0756  . hydrOXYzine (ATARAX/VISTARIL) tablet 25 mg  25 mg Oral TID PRN Laveda Abbe, NP   25 mg at 09/30/20 2053  . levothyroxine (SYNTHROID) tablet 50 mcg  50 mcg Oral Q0600 Laveda Abbe, NP   50 mcg at 10/01/20 0543  . loratadine (CLARITIN) tablet 10 mg  10 mg Oral Daily Laveda Abbe, NP   10 mg at 10/01/20 0756  . magnesium hydroxide (MILK OF MAGNESIA) suspension 30 mL  30 mL Oral Daily PRN Laveda Abbe, NP      . melatonin tablet 3 mg  3 mg Oral QHS Claudie Revering, MD   3 mg at 09/30/20 2053  . pantoprazole (PROTONIX) EC tablet 40 mg  40 mg Oral Daily Claudie Revering, MD   40 mg at 10/01/20 0756  . [START ON 10/02/2020] sertraline (ZOLOFT) tablet 75 mg  75 mg Oral Daily Claudie Revering, MD      . traZODone (DESYREL) tablet 25 mg  25 mg Oral QHS PRN Claudie Revering, MD   25 mg at 09/30/20 2053   PTA Medications: Medications Prior to Admission   Medication Sig Dispense Refill Last Dose  . buPROPion (WELLBUTRIN XL) 150 MG 24 hr tablet Take 150 mg by mouth daily.     . busPIRone (BUSPAR) 10 MG tablet Take 10 mg by mouth 3 (three) times daily as needed.     . cetirizine (ZYRTEC) 10 MG tablet Take 10 mg by mouth daily.     . fluticasone (FLONASE) 50 MCG/ACT nasal spray Place into both nostrils daily.     Marland Kitchen levothyroxine (SYNTHROID) 50 MCG tablet Take 50 mcg by mouth daily before breakfast.     . omeprazole (PRILOSEC) 40 MG capsule Take 40 mg by mouth daily.       Patient Stressors: Loss of marriage Marital or family conflict  Patient Strengths: Ability for insight Average or above average intelligence Capable of independent living Metallurgist fund of knowledge Physical Health Supportive family/friends  Treatment Modalities: Medication Management, Group therapy, Case management,  1 to 1 session with clinician, Psychoeducation, Recreational therapy.   Physician Treatment Plan for Primary Diagnosis: MDD (major depressive disorder), recurrent severe, without psychosis (HCC) Long Term Goal(s): Improvement in symptoms so as ready for discharge Improvement in symptoms so as ready for discharge   Short Term Goals: Ability to identify changes in lifestyle to reduce recurrence of condition will improve Ability to  verbalize feelings will improve Ability to disclose and discuss suicidal ideas Ability to identify and develop effective coping behaviors will improve Ability to maintain clinical measurements within normal limits will improve Ability to identify triggers associated with substance abuse/mental health issues will improve Ability to identify changes in lifestyle to reduce recurrence of condition will improve Ability to verbalize feelings will improve Ability to disclose and discuss suicidal ideas Ability to identify and develop effective coping behaviors will improve Ability to maintain  clinical measurements within normal limits will improve Ability to identify triggers associated with substance abuse/mental health issues will improve  Medication Management: Evaluate patient's response, side effects, and tolerance of medication regimen.  Therapeutic Interventions: 1 to 1 sessions, Unit Group sessions and Medication administration.  Evaluation of Outcomes: Progressing  Physician Treatment Plan for Secondary Diagnosis: Principal Problem:   MDD (major depressive disorder), recurrent severe, without psychosis (HCC) Active Problems:   Anxiety disorder   Suicide attempt (HCC)  Long Term Goal(s): Improvement in symptoms so as ready for discharge Improvement in symptoms so as ready for discharge   Short Term Goals: Ability to identify changes in lifestyle to reduce recurrence of condition will improve Ability to verbalize feelings will improve Ability to disclose and discuss suicidal ideas Ability to identify and develop effective coping behaviors will improve Ability to maintain clinical measurements within normal limits will improve Ability to identify triggers associated with substance abuse/mental health issues will improve Ability to identify changes in lifestyle to reduce recurrence of condition will improve Ability to verbalize feelings will improve Ability to disclose and discuss suicidal ideas Ability to identify and develop effective coping behaviors will improve Ability to maintain clinical measurements within normal limits will improve Ability to identify triggers associated with substance abuse/mental health issues will improve     Medication Management: Evaluate patient's response, side effects, and tolerance of medication regimen.  Therapeutic Interventions: 1 to 1 sessions, Unit Group sessions and Medication administration.  Evaluation of Outcomes: Progressing   RN Treatment Plan for Primary Diagnosis: MDD (major depressive disorder), recurrent severe,  without psychosis (HCC) Long Term Goal(s): Knowledge of disease and therapeutic regimen to maintain health will improve  Short Term Goals: Ability to remain free from injury will improve, Ability to participate in decision making will improve, Ability to identify and develop effective coping behaviors will improve and Compliance with prescribed medications will improve  Medication Management: RN will administer medications as ordered by provider, will assess and evaluate patient's response and provide education to patient for prescribed medication. RN will report any adverse and/or side effects to prescribing provider.  Therapeutic Interventions: 1 on 1 counseling sessions, Psychoeducation, Medication administration, Evaluate responses to treatment, Monitor vital signs and CBGs as ordered, Perform/monitor CIWA, COWS, AIMS and Fall Risk screenings as ordered, Perform wound care treatments as ordered.  Evaluation of Outcomes: Progressing   LCSW Treatment Plan for Primary Diagnosis: MDD (major depressive disorder), recurrent severe, without psychosis (HCC) Long Term Goal(s): Safe transition to appropriate next level of care at discharge, Engage patient in therapeutic group addressing interpersonal concerns.  Short Term Goals: Engage patient in aftercare planning with referrals and resources, Increase social support, Increase emotional regulation, Identify triggers associated with mental health/substance abuse issues and Increase skills for wellness and recovery  Therapeutic Interventions: Assess for all discharge needs, 1 to 1 time with Social worker, Explore available resources and support systems, Assess for adequacy in community support network, Educate family and significant other(s) on suicide prevention, Complete Psychosocial Assessment, Interpersonal group  therapy.  Evaluation of Outcomes: Progressing   Progress in Treatment: Attending groups: Yes. Participating in groups: Yes. Taking  medication as prescribed: Yes. Toleration medication: Yes. Family/Significant other contact made: Yes, individual(s) contacted:  son Patient understands diagnosis: Yes. Discussing patient identified problems/goals with staff: Yes. Medical problems stabilized or resolved: Yes. Denies suicidal/homicidal ideation: Yes. Issues/concerns per patient self-inventory: No.   New problem(s) identified: No, Describe:  none  New Short Term/Long Term Goal(s): medication stabilization, elimination of SI thoughts, development of comprehensive mental wellness plan.   Patient Goals:  "To figure out ways to move forward"  Discharge Plan or Barriers: Pt is unable to return to live at discharge. Pt is to return to stay with sister in [place] at discharge. Pt is established with a virtual therapist and receives medication management through her primary care provider and does not want to change services.   Reason for Continuation of Hospitalization: Anxiety Depression Medication stabilization  Estimated Length of Stay: 1-3 days  Attendees: Patient:  10/01/2020   Physician:  10/01/2020   Nursing:  10/01/2020   RN Care Manager: 10/01/2020  Social Worker: Ruthann Cancer, LCSW 10/01/2020   Recreational Therapist:  10/01/2020   Other:  10/01/2020   Other:  10/01/2020   Other: 10/01/2020     Scribe for Treatment Team: Otelia Santee, LCSW 10/01/2020 11:52 AM

## 2020-10-01 NOTE — Progress Notes (Signed)
[facility] MD Progress Note  10/01/2020 5:14 PM Hannah [name]  MRN:  [id number] Subjective:  "I was feeling better over the weekend but I am feeling more down today because of phone calls to family."  Objective: Medical record reviewed.  The patient's case was discussed in detail with members of the treatment team.  I saw and interviewed the patient today in the patient's room.  The patient reports that her mood had been much better over the weekend and she did not feel depressed on Saturday or Sunday.  Her mood is worse today because she tried to reach her estranged husband and mother-in-law by phone and they did not take her calls.  She denies passive wish for death or suicidal ideation intent preparation or plan.  She denies any urges to engage in nonsuicidal self-injurious behavior.  The patient reports that she has been sleeping and eating okay.  She has been reading.  Concentration is good.  She was able to enjoy some of the groups over the weekend.  She denies AI, HI, AH, VH and PI.  The patient denies any physical problems.  She denies side effects from her medications.  The patient is interested to know whether she can transition directly into a residential program or what the next steps are for her.  The patient has been attending groups and participating appropriately.  She has been taking standing dose medications as prescribed.  She took as needed trazodone and as needed hydroxyzine last night for sleep and anxiety.  Her vital signs have been within normal limits.  She is eating and sleeping well.   Principal Problem: MDD (major depressive disorder), recurrent severe, without psychosis (HCC) Diagnosis: Principal Problem:   MDD (major depressive disorder), recurrent severe, without psychosis (HCC) Active Problems:   Anxiety disorder   Suicide attempt (HCC)  Total Time spent with patient: 20 minutes  Past Psychiatric History: See H&P  Past Medical History:  Past Medical History:  Diagnosis  Date  . Allergy   . Anxiety   . Asthma   . Depression   . GERD (gastroesophageal reflux disease)   . Migraine     Past Surgical History:  Procedure Laterality Date  . ABDOMINAL HYSTERECTOMY  2004  . APPENDECTOMY  2004  . CERVICAL CONIZATION W/BX  11/1995  . CESAREAN SECTION      x2  . laposcophy  05/1996  . TONSILLECTOMY    . TUBAL LIGATION    . WISDOM TOOTH EXTRACTION     x 4   Family History:  Family History  Adopted: Yes  Problem Relation Age of Onset  . Arthritis Mother   . Depression Mother   . Hypertension Mother   . Heart disease Mother        CHF  . Kidney disease Mother   . Diabetes Father   . Depression Sister   . Cancer Brother        colon, lungs, liver  . Diabetes Paternal Grandmother    Family Psychiatric  History: See H&P Social History:  Social History   Substance and Sexual Activity  Alcohol Use Yes   Comment: occasion "rare"     Social History   Substance and Sexual Activity  Drug Use No    Social History   Socioeconomic History  . Marital status: Married    Spouse name: Not on file  . Number of children: Not on file  . Years of education: Not on file  . Highest education level: Not  on file  Occupational History  . Not on file  Tobacco Use  . Smoking status: Former Smoker    Packs/day: 1.00    Years: 9.00    Pack years: 9.00    Types: Cigarettes    Quit date: 12/28/1990    Years since quitting: 29.7  . Smokeless tobacco: Never Used  . Tobacco comment: quit 1992  Vaping Use  . Vaping Use: Every day  Substance and Sexual Activity  . Alcohol use: Yes    Comment: occasion "rare"  . Drug use: No  . Sexual activity: Not Currently    Partners: Male    Birth control/protection: Surgical  Other Topics Concern  . Not on file  Social History Narrative  . Not on file   Social Determinants of Health   Financial Resource Strain: Not on file  Food Insecurity: Not on file  Transportation Needs: Not on file  Physical Activity:  Not on file  Stress: Not on file  Social Connections: Not on file   Additional Social History:                         Sleep: Good  Appetite:  Good  Current Medications: Current Facility-Administered Medications  Medication Dose Route Frequency Provider Last Rate Last Admin  . acetaminophen (TYLENOL) tablet 650 mg  650 mg Oral Q6H PRN Laveda Abbe, NP   650 mg at 09/24/20 2123  . alum & mag hydroxide-simeth (MAALOX/MYLANTA) 200-200-20 MG/5ML suspension 30 mL  30 mL Oral Q4H PRN Laveda Abbe, NP      . buPROPion (WELLBUTRIN XL) 24 hr tablet 300 mg  300 mg Oral Daily Laveda Abbe, NP   300 mg at 10/01/20 0756  . fluticasone (FLONASE) 50 MCG/ACT nasal spray 1 spray  1 spray Each Nare Daily Laveda Abbe, NP   1 spray at 10/01/20 0756  . hydrOXYzine (ATARAX/VISTARIL) tablet 25 mg  25 mg Oral TID PRN Laveda Abbe, NP   25 mg at 09/30/20 2053  . levothyroxine (SYNTHROID) tablet 50 mcg  50 mcg Oral Q0600 Laveda Abbe, NP   50 mcg at 10/01/20 0543  . loratadine (CLARITIN) tablet 10 mg  10 mg Oral Daily Laveda Abbe, NP   10 mg at 10/01/20 0756  . magnesium hydroxide (MILK OF MAGNESIA) suspension 30 mL  30 mL Oral Daily PRN Laveda Abbe, NP      . melatonin tablet 3 mg  3 mg Oral QHS Claudie Revering, MD   3 mg at 09/30/20 2053  . pantoprazole (PROTONIX) EC tablet 40 mg  40 mg Oral Daily Claudie Revering, MD   40 mg at 10/01/20 0756  . [START ON 10/02/2020] sertraline (ZOLOFT) tablet 75 mg  75 mg Oral Daily Claudie Revering, MD      . traZODone (DESYREL) tablet 25 mg  25 mg Oral QHS PRN Claudie Revering, MD   25 mg at 09/30/20 2053    Lab Results: No results found for this or any previous visit (from the past 48 hour(s)).  Blood Alcohol level:  Lab Results  Component Value Date   ETH 148 (H) 09/22/2020    Metabolic Disorder Labs: Lab Results  Component Value Date   HGBA1C 5.3 09/25/2020   MPG 105.41 09/25/2020    No results found for: PROLACTIN Lab Results  Component Value Date   CHOL 196 09/25/2020   TRIG 189 (H) 09/25/2020  HDL 47 09/25/2020   CHOLHDL 4.2 09/25/2020   VLDL 38 09/25/2020   LDLCALC 111 (H) 09/25/2020   LDLCALC 157 (H) 10/31/2016    Physical Findings: AIMS: Facial and Oral Movements Muscles of Facial Expression: None, normal Lips and Perioral Area: None, normal Jaw: None, normal Tongue: None, normal,Extremity Movements Upper (arms, wrists, hands, fingers): None, normal Lower (legs, knees, ankles, toes): None, normal, Trunk Movements Neck, shoulders, hips: None, normal, Overall Severity Severity of abnormal movements (highest score from questions above): None, normal Incapacitation due to abnormal movements: None, normal Patient's awareness of abnormal movements (rate only patient's report): No Awareness, Dental Status Current problems with teeth and/or dentures?: No Does patient usually wear dentures?: No  CIWA:    COWS:     Musculoskeletal: Strength & Muscle Tone: within normal limits Gait & Station: normal Patient leans: N/A  Psychiatric Specialty Exam:  Presentation  General Appearance: Appropriate for Environment; Well Groomed  Eye Contact:Good  Speech:Clear and Coherent; Normal Rate  Speech Volume:Normal  Handedness:Right   Mood and Affect  Mood:Depressed  Affect:Congruent   Thought Process  Thought Processes:Coherent; Goal Directed; Linear  Descriptions of Associations:Intact  Orientation:Full (Time, Place and Person)  Thought Content:Logical  History of Schizophrenia/Schizoaffective disorder:No  Duration of Psychotic Symptoms:No data recorded Hallucinations:Hallucinations: None  Ideas of Reference:None  Suicidal Thoughts:Suicidal Thoughts: No  Homicidal Thoughts:Homicidal Thoughts: No   Sensorium  Memory:Immediate Good; Recent Good; Remote Good  Judgment:Good  Insight:Good   Executive Functions   Concentration:Good  Attention Span:Good  Recall:Good  Fund of Knowledge:Good  Language:Good   Psychomotor Activity  Psychomotor Activity:Psychomotor Activity: Normal   Assets  Assets:Communication Skills; Desire for Improvement; Financial Resources/Insurance; Physical Health; Resilience; Social Support; Vocational/Educational   Sleep  Sleep:Sleep: Good    Physical Exam: Physical Exam Vitals and nursing note reviewed.  Constitutional:      Appearance: Normal appearance.  HENT:     Head: Normocephalic and atraumatic.  Pulmonary:     Effort: Pulmonary effort is normal.  Neurological:     General: No focal deficit present.     Mental Status: She is alert and oriented to person, place, and time.    Review of Systems  Constitutional: Negative for chills, diaphoresis and fever.  HENT: Negative for congestion and sore throat.   Eyes: Negative for blurred vision.  Respiratory: Negative for cough and shortness of breath.   Cardiovascular: Negative for chest pain and palpitations.  Gastrointestinal: Negative for constipation, diarrhea, nausea and vomiting.  Genitourinary: Negative for dysuria.  Musculoskeletal: Negative for joint pain and myalgias.  Skin: Negative for rash.  Neurological: Negative for dizziness, tremors and headaches.  Endo/Heme/Allergies: Does not bruise/bleed easily.  Psychiatric/Behavioral: Positive for depression. Negative for hallucinations and suicidal ideas. The patient does not have insomnia.    Blood pressure 107/63, pulse 76, temperature 98.7 F (37.1 C), temperature source Oral, resp. rate 18, height 4\' 11"  (1.499 m), weight 71.7 kg, SpO2 98 %. Body mass index is 31.91 kg/m.   Treatment Plan Summary: The patient is a 55 year old female with major depressive disorder and a history of nonsuicidal self-injurious behavior who was admitted following a suicide attempt for treatment of symptoms of major depression.  Suspect cluster B traits  possibly contributing to symptoms and presentation.  Patient is improving on her current medications.  Anticipate she will likely discharge within the next 24 to 48 hours..    Daily contact with patient to assess and evaluate symptoms and progress in treatment, Medication management and Plan :  Continue every 15-minute observation level for safety of patient  Depression/anxiety -Continue Wellbutrin XL 300 mg daily -Increase sertraline 75 mg daily (increased 10/01/2020) -Continue hydroxyzine 25 mg 3 times daily as neededforanxiety  Insomnia -Continue melatonin 3 mg nightly standing dose -Continue trazodone to 25 mg nightly as needed for insomnia  Seasonal allergies -Continue Flonase, Claritin  Hypothyroidism -TSH WNL -Continue Synthroid 50 mcg daily  GERD -Continue Protonix 40 mg daily  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  Patient had preferred to go directly from inpatient hospital to residential program.  Unfortunately it appears there will be a delay in availability of residential program.  Plan is for patient to stay with family member in the interim and then start residential program when space is available.  Social work continues to clarify patient's housing plan for where she will stay immediately following discharge.  I certify that inpatient services furnished can reasonably be expected to improve the patient's condition.   Claudie Revering, MD 10/01/2020, 5:14 PM

## 2020-10-01 NOTE — [facility] Group Notes (Addendum)
Occupational Therapy Group Note Date: 10/01/2020 Group Topic/Focus: Health and Wellness  Group Description: Group encouraged increased engagement and participation through discussion focused on Stages of Change. Group reviewed and educated on the five stages of change, including pre-contemplation, contemplation, preparation, action, and maintenance. The sixth stage that not everyone goes through 'relapse' was also reviewed. Discussion focused on patients sharing their own journey with mental health and/or substance use and identified which stage they feel as though they are currently in and what steps that have to take next in order to move forward into the following stage. Peer support was also observed and encouraged throughout discussion.   Therapeutic Goal(s): Identify and review stages of change as it relates to one's own mental health and/or substance use journey. Identify and explain which stage of change one currently identifies with and what next steps one needs to take to move forward in their recovery. Participation Level: Active   Participation Quality: Independent   Behavior: Calm and Cooperative   Speech/Thought Process: Focused   Affect/Mood: Euthymic   Insight: Moderate   Judgement: Moderate   Individualization: Hannah [name] was active in their participation of group discussion/activity. She did share a personal example and asked "What if I was forced into change, like I was left from a relationship and now I have to deal with it? What stage would that be?" She appeared attentive to discussion and receptive to education received.   Modes of Intervention: Discussion, Education and Problem-solving  Patient Response to Interventions:  Attentive, Engaged and Receptive   Plan: Continue to engage patient in OT groups 2 - 3x/week.  10/01/2020  Donne Hazel, MOT, OTR/L

## 2020-10-02 MED ORDER — SERTRALINE HCL 100 MG PO TABS
100.0000 mg | ORAL_TABLET | Freq: Every day | ORAL | Status: DC
  Filled 2020-10-02: qty 1

## 2020-10-02 MED ORDER — BUPROPION HCL ER (XL) 150 MG PO TB24: 150.0000 mg | ORAL_TABLET | Freq: Every day | ORAL | 0 refills | Status: AC

## 2020-10-02 MED ORDER — SERTRALINE HCL 100 MG PO TABS: 100.0000 mg | ORAL_TABLET | Freq: Every day | ORAL | 0 refills | Status: AC

## 2020-10-02 MED ORDER — HYDROXYZINE HCL 25 MG PO TABS: 25.0000 mg | ORAL_TABLET | Freq: Three times a day (TID) | ORAL | 0 refills | Status: AC | PRN

## 2020-10-02 MED ORDER — TRAZODONE HCL 50 MG PO TABS: 25.0000 mg | ORAL_TABLET | Freq: Every evening | ORAL | 0 refills | Status: AC | PRN

## 2020-10-02 MED ORDER — MELATONIN 3 MG PO TABS
3.0000 mg | ORAL_TABLET | Freq: Every day | ORAL | 0 refills | Status: AC
Start: 2020-10-02 — End: ?

## 2020-10-02 NOTE — Progress Notes (Signed)
CI checked in on pt since pt is discharging   Pt spoke about her recent separation and how she is processing that  Her exhusband has been "nice" but it is still hard after 31 years.  Pt spoke about her anxiety around leaving but happy to see her daughter and going to [place] to live with her sister   Leane Para  Counseling Intern @ [facility]

## 2020-10-02 NOTE — Progress Notes (Cosign Needed)
Adult Psychoeducational Group Note  Date:  10/02/2020 Time:  10:22 AM  Group Topic/Focus:  Goals Group:   The focus of this group is to help patients establish daily goals to achieve during treatment and discuss how the patient can incorporate goal setting into their daily lives to aide in recovery.  Participation Level:  Active  Participation Quality:  Appropriate  Affect:  Appropriate  Cognitive:  Alert  Insight: Appropriate  Engagement in Group:  Engaged  Modes of Intervention:  Discussion  Additional Comments:  Pt attended group and participated in discussion.  Dreydon Cardenas R Leslee Suire 10/02/2020, 10:22 AM

## 2020-10-02 NOTE — Plan of Care (Signed)
Nurse discussed anxiety, depression and coping skills with patient.  

## 2020-10-02 NOTE — Discharge Summary (Signed)
Physician Discharge Summary Note  Patient:  Hannah [name] is an 55 y.o., female MRN:  [id number] DOB:  11-15-1965 Patient phone:  [phone] (home)  Patient address:   [address] [place] [address],  Total Time spent with patient: 30 minutes  Date of Admission:  09/24/2020 Date of Discharge: 10/02/2020  Reason for Admission:  (From MD's admission note):  Hannah [name] is a 55 year old female with a history of depression and anxiety who was taken to a local emergency department by law enforcement after she made a suicide attempt by carbon monoxide poisoning and texted her husband that she was going to commit suicide while in a locked car with the ignition running.  Her husband called 911.  Patient consumed approximately 8 beers, locked herself in the car in the garage and turned on the ignition in a closed garage before texting her husband.  Her BAL was 148 in the emergency department.  On interview today the patient states that she has been really depressed since the beginning of the year when she separated from her husband of 31 years.  She also has multiple other life changes as result of the break-up of her marriage, including moving to a new town, leaving a job she loved, starting a new job, leaving friends that she liked to move back to [place].  The patient reports feeling very stressed by the changes.  She has had depressive symptoms including depressed mood, anxiety, feeling numb, decreased interest, decreased energy, decreased appetite, feelings of guilt and worthlessness and suicidal ideation.  She reports that she has been sleeping okay (6 to 8 hours per night).  The patient states that on Saturday night she felt more depressed because she was alone for the weekend.  She drank approximately 8 beers and then locked herself in the car in the garage and turned on the ignition.  Patient reports that she texted her husband that she was going to kill herself and he called 911.  Police  transported her to the emergency department.   Patient reports feeling better since the day of her attempt following transfer to the [facility].  She slept well last night and her mood is better.  She denies any current suicidal ideation intent prep or plan.  She denies passive wish for death.  She denies AI, HI, PI, AH, VH.  The patient states that she was started on Wellbutrin approximately 3 weeks ago and the dose was recently increased 1 week ago from 150 mg daily to 300 mg daily.  She states that she was started on BuSpar 7.5 mg twice a day in November 2021.  She feels that the medications have not had a chance to work.  The patient reports she has also been journaling, exercising regularly and started going to the therapy.  The patient blames her suicide attempt on Saturday night to her alcohol intake that night.  She reports that she almost never drinks alcohol and when she does she typically drinks 1-2 drinks per occasion.  She denies any drug use; however, she notes that she has been vaping more frequently recently to decrease her anxiety.  She reports that she has had problems with depression on and off since the 10th or 11th grade. She denies any history of manic episodes.  She denies any history of psychotic symptoms.  She denies prior suicide attempts.  She denies prior inpatient psychiatric admissions.  She denies access to firearms.  She gives permission to allow  her 53 year old son Jill AlexandersJustin to be contacted.    Evaluation on the unit, day of discharge: patient was seen and evaluated on the unit today. Patient stated she is anxious about discharge but knows [place] will [place] to [place] and then [facility]. She is calm and cooperative. She reported good sleep and her appetite is good. She has been taking her medications and has no complaint of side effects. Patient has been attending group and interacting appropriately with staff and  peers. Patient denies suicidal ideation, plan or intent. She denies access to weapons. She denies homicidal ideation, auditory and visual hallucinations, paranoia and delusions. Patient was provided with prescriptions which she can fill prior to going [place] and will have enough medications until she [facility]. Patient is on the wait list for [facility] in [place] and will be notified when a bed is available. Patient is stable for discharge today.   Principal Problem: MDD (major depressive disorder), recurrent severe, without psychosis (HCC) Discharge Diagnoses: Principal Problem:   MDD (major depressive disorder), recurrent severe, without psychosis (HCC) Active Problems:   Anxiety disorder   Suicide attempt [facility](HCC)   Past Psychiatric History: See H&P  Past Medical History:  Past Medical History:  Diagnosis Date  . Allergy   . Anxiety   . Asthma   . Depression   . GERD (gastroesophageal reflux disease)   . Migraine     Past Surgical History:  Procedure Laterality Date  . ABDOMINAL HYSTERECTOMY  2004  . APPENDECTOMY  2004  . CERVICAL CONIZATION W/BX  11/1995  . CESAREAN SECTION      x2  . laposcophy  05/1996  . TONSILLECTOMY    . TUBAL LIGATION    . WISDOM TOOTH EXTRACTION     x 4   Family History:  Family History  Adopted: Yes  Problem Relation Age of Onset  . Arthritis Mother   . Depression Mother   . Hypertension Mother   . Heart disease Mother        CHF  . Kidney disease Mother   . Diabetes Father   . Depression Sister   . Cancer Brother        colon, lungs, liver  . Diabetes Paternal Grandmother    Family Psychiatric  History: See H&P Social History:  Social History   Substance and Sexual Activity  Alcohol Use Yes   Comment: occasion "rare"     Social History   Substance and Sexual Activity  Drug Use No    Social History   Socioeconomic History  . Marital status: Married    Spouse name: Not on file  . Number of children: Not on file  .  Years of education: Not on file  . Highest education level: Not on file  Occupational History  . Not on file  Tobacco Use  . Smoking status: Former Smoker    Packs/day: 1.00    Years: 9.00    Pack years: 9.00    Types: Cigarettes    Quit date: 12/28/1990    Years since quitting: 29.7  . Smokeless tobacco: Never Used  . Tobacco comment: quit 1992  Vaping Use  . Vaping Use: Every day  Substance and Sexual Activity  . Alcohol use: Yes    Comment: occasion "rare"  . Drug use: No  . Sexual activity: Not Currently    Partners: Male    Birth control/protection: Surgical  Other Topics Concern  .  Not on file  Social History Narrative  . Not on file   Social Determinants of Health   Financial Resource Strain: Not on file  Food Insecurity: Not on file  Transportation Needs: Not on file  Physical Activity: Not on file  Stress: Not on file  Social Connections: Not on file    Hospital Course:  After the above admission evaluation, Kennidy's presenting symptoms were noted. She was recommended for mood stabilization treatments. The medication regimen targeting those presenting symptoms were discussed with her & initiated with her consent. Her UDS on arrival to the ED was negative, BAL was 148. She did not develop any alcohol withdrawal symptoms & did not receive alcohol detoxification treatments. She was however medicated, stabilized & discharged on the medications as listed on her discharge medication lists below. Besides the mood stabilization treatments, Amena was also enrolled & participated in the group counseling sessions being offered & held on this unit. She learned coping skills. She has been ruminative about her divorce and often wants to discuss this with providers and staff. Patient is easily redirected to focus on herself and her treatment. She presented no other significant pre-existing medical issues that required treatment. She tolerated his treatment regimen without any adverse  effects or reactions reported.   During the course of her hospitalization, the 15-minute checks were adequate to ensure patient's safety. Taronda did not display any dangerous, violent or suicidal behavior on the unit.  She interacted with patients & staff appropriately, participated appropriately in the group sessions/therapies. Her medications were addressed & adjusted to meet her needs. She was recommended for outpatient follow-up care & medication management upon discharge to assure continuity of care & mood stability.  At the time of discharge patient is not reporting any acute suicidal/homicidal ideations. She feels more confident about her self-care & in managing his mental health. She currently denies any new issues or concerns. Education and supportive counseling provided throughout his/her hospital stay & upon discharge.   Today upon her discharge evaluation with the attending psychiatrist, Franca shares she is doing well. She denies any other specific concerns. She is sleeping well. Her appetite is good. She denies other physical complaints. She denies AH/VH, delusional thoughts or paranoia. She does not appear to be responding to any internal stimuli. She feels that her medications have been helpful & is in agreement to continue her current treatment regimen as recommended. She was able to engage in safety planning including plan to return to [facility] or contact emergency services if she feels unable to maintain her own safety or the safety of others. Pt had no further questions, comments, or concerns. She left [facility] with all personal belongings in no apparent distress. Transportation to her father's home in [place] via her daughter.    Physical Findings: AIMS: Facial and Oral Movements Muscles of Facial Expression: None, normal Lips and Perioral Area: None, normal Jaw: None, normal Tongue: None, normal,Extremity Movements Upper (arms, wrists, hands, fingers): None, normal Lower (legs, knees,  ankles, toes): None, normal, Trunk Movements Neck, shoulders, hips: None, normal, Overall Severity Severity of abnormal movements (highest score from questions above): None, normal Incapacitation due to abnormal movements: None, normal Patient's awareness of abnormal movements (rate only patient's report): No Awareness, Dental Status Current problems with teeth and/or dentures?: No Does patient usually wear dentures?: No  CIWA:    COWS:     Musculoskeletal: Strength & Muscle Tone: within normal limits Gait & Station: normal Patient leans: N/A  Psychiatric Specialty Exam:  Presentation  General Appearance: Appropriate for Environment; Well Groomed; Casual  Eye Contact:Good  Speech:Clear and Coherent; Normal Rate  Speech Volume:Normal  Handedness:Right  Mood and Affect  Mood:Depressed  Affect:Congruent  Thought Process  Thought Processes:Coherent; Goal Directed; Linear  Descriptions of Associations:Intact  Orientation:Full (Time, Place and Person)  Thought Content:Logical  History of Schizophrenia/Schizoaffective disorder:No  Duration of Psychotic Symptoms:No data recorded Hallucinations:Hallucinations: None  Ideas of Reference:None  Suicidal Thoughts:Suicidal Thoughts: No  Homicidal Thoughts:Homicidal Thoughts: No  Sensorium  Memory:Immediate Good; Recent Good; Remote Good  Judgment:Good  Insight:Good  Executive Functions  Concentration:Good  Attention Span:Good  Recall:Good  Fund of Knowledge:Good  Language:Good   Psychomotor Activity  Psychomotor Activity:Psychomotor Activity: Normal   Assets  Assets:Communication Skills; Desire for Improvement; Financial Resources/Insurance; Physical Health; Resilience; Social Support; Tax adviser; Leisure Time; Housing  Sleep  Sleep:Sleep: Good  Physical Exam: Physical Exam Constitutional:      Appearance: Normal appearance.  Pulmonary:     Effort: Pulmonary effort is  normal.  Musculoskeletal:        General: Normal range of motion.     Cervical back: Normal range of motion.  Neurological:     Mental Status: She is alert and oriented to person, place, and time.  Psychiatric:        Attention and Perception: Attention and perception normal. She is attentive. She does not perceive auditory hallucinations.        Mood and Affect: Mood normal.        Speech: Speech normal.        Behavior: Behavior normal. Behavior is cooperative.        Thought Content: Thought content normal. Thought content does not include suicidal ideation. Thought content does not include suicidal plan.        Cognition and Memory: Cognition normal.    ROS Blood pressure 119/65, pulse 67, temperature 98.1 F (36.7 C), temperature source Oral, resp. rate 18, height 4\' 11"  (1.499 m), weight 71.7 kg, SpO2 98 %. Body mass index is 31.91 kg/m.      Has this patient used any form of tobacco in the last 30 days? (Cigarettes, Smokeless Tobacco, Cigars, and/or Pipes) Yes, N/A  Blood Alcohol level:  Lab Results  Component Value Date   ETH 148 (H) 09/22/2020    Metabolic Disorder Labs:  Lab Results  Component Value Date   HGBA1C 5.3 09/25/2020   MPG 105.41 09/25/2020   No results found for: PROLACTIN Lab Results  Component Value Date   CHOL 196 09/25/2020   TRIG 189 (H) 09/25/2020   HDL 47 09/25/2020   CHOLHDL 4.2 09/25/2020   VLDL 38 09/25/2020   LDLCALC 111 (H) 09/25/2020   LDLCALC 157 (H) 10/31/2016    See Psychiatric Specialty Exam and Suicide Risk Assessment completed by Attending Physician prior to discharge.  Discharge destination:  Home  Is patient on multiple antipsychotic therapies at discharge:  No   Has Patient had three or more failed trials of antipsychotic monotherapy by history:  No  Recommended Plan for Multiple Antipsychotic Therapies: NA  Discharge Instructions    Diet - low sodium heart healthy   Complete by: As directed    Increase activity  slowly   Complete by: As directed      Allergies as of 10/02/2020      Reactions   Cough-cold Medicine  [phenir-pe-sod Sal-caff Cit] Itching   Elemental Sulfur    Erythromycin    Levofloxacin    headache, chest discomfort, insomnia  Sulfonamide Derivatives    Tetracyclines & Related    Tramadol    Lips and mouth numbness.      Medication List    STOP taking these medications   busPIRone 10 MG tablet Commonly known as: BUSPAR     TAKE these medications     Indication  buPROPion 150 MG 24 hr tablet Commonly known as: WELLBUTRIN XL Take 1 tablet (150 mg total) by mouth daily. Take 300 mg daily of your home supply of this medication. What changed: additional instructions  Indication: Major Depressive Disorder   cetirizine 10 MG tablet Commonly known as: ZYRTEC Take 10 mg by mouth daily.  Indication: Hayfever   fluticasone 50 MCG/ACT nasal spray Commonly known as: FLONASE Place into both nostrils daily.  Indication: Nonallergic Rhinitis   hydrOXYzine 25 MG tablet Commonly known as: ATARAX/VISTARIL Take 1 tablet (25 mg total) by mouth 3 (three) times daily as needed for anxiety.  Indication: Feeling Anxious   levothyroxine 50 MCG tablet Commonly known as: SYNTHROID Take 50 mcg by mouth daily before breakfast.  Indication: Underactive Thyroid   melatonin 3 MG Tabs tablet Take 1 tablet (3 mg total) by mouth at bedtime.  Indication: Trouble Sleeping   omeprazole 40 MG capsule Commonly known as: PRILOSEC Take 40 mg by mouth daily.  Indication: Gastroesophageal Reflux Disease   sertraline 100 MG tablet Commonly known as: ZOLOFT Take 1 tablet (100 mg total) by mouth daily. Start taking on: October 03, 2020  Indication: Major Depressive Disorder   traZODone 50 MG tablet Commonly known as: DESYREL Take 0.5 tablets (25 mg total) by mouth at bedtime as needed for sleep.  Indication: Trouble Sleeping       Follow-up Information    [facility] Follow up.   Why: This  facility will contact you for admission when there is an open bed. Please contact them weekly for a status of avalibility.  Contact information: [address] [place], [place] [address]  [phone]       [facility] Follow up on 10/03/2020.   Why: You have an Virtual appointment for therapy services on 10/03/20 at 10:00 am with Corene Cornea, Virtual.  You also have an appointment for medication management on 11/01/20 at 11:30 am (first appointment needs to be in person).  Contact information: [address] [place] [address] [phone]               Follow-up recommendations:  Activity:  as tolerated Diet:  Heart Healthy  Comments:  Prescriptions for 30 days of medication given at discharge.  Patient agreeable to discharge plan.  Patient was given the opportunity to ask questions.  She appears to feel comfortable with discharge and denies any current suicidal or homicidal thoughts.   Patient is instructed prior to discharge to: Take all medications as prescribed by her mental healthcare provider. Report any adverse effects and or reactions from the medicines to her outpatient provider promptly. Patient has been instructed & cautioned: To not engage in alcohol and or illegal drug use while on prescription medicines. In the event of worsening symptoms, patient is instructed to call the crisis hotline, 911 and or go to the nearest ED for appropriate evaluation and treatment of symptoms. To follow-up with her primary care provider for your other medical issues, concerns and or health care needs.   Signed: Laveda Abbe, NP 10/02/2020, 1:15 PM

## 2020-10-02 NOTE — Progress Notes (Signed)
D:  Patient's self inventory sheet, patient sleeps good, sleep medication helpful.  Fair appetite, normal energy level, good concentration.  Denied depression, hopeless and anxiety.  Denied withdrawals.  Denied SI.  Denied physical problems.  Denied physical pain.  Goal is discharge.  Does have discharge plans. A:  Medications administered per MD orders.  Emotional support and encouragement given patient. R:  Denied SI and HI, contracts for safety.  Denied A/V hallucinations.  Safety maintained with 15 minute checks.

## 2020-10-02 NOTE — Progress Notes (Signed)
Discharge Note:  Patient discharged home with daughter.  Suicide prevention information given and discussed with patient who stated she understood and had no questions.  Patient denied SI and HI.  Denied A/V hallucinations.  Patient stated she received all her belongings, clothing, toiletries, misc items, etc. Patient stated she appreciated all assistance received from [facility] staff.  All required discharge information given.

## 2020-10-02 NOTE — B[facility] Suicide Risk Assessment (Addendum)
[facility] Discharge Suicide Risk Assessment   Principal Problem: MDD (major depressive disorder), recurrent severe, without psychosis (HCC) Discharge Diagnoses: Principal Problem:   MDD (major depressive disorder), recurrent severe, without psychosis (HCC) Active Problems:   Anxiety disorder   Suicide attempt (HCC)   Total Time spent with patient: 20 minutes  Musculoskeletal: Strength & Muscle Tone: within normal limits Gait & Station: normal Patient leans: N/A  Psychiatric Specialty Exam: Review of Systems  Constitutional: Negative for activity change and fever.  HENT: Negative for sore throat.   Respiratory: Negative for cough and shortness of breath.   Cardiovascular: Negative for chest pain and palpitations.  Gastrointestinal: Negative for constipation, diarrhea, nausea and vomiting.  Genitourinary: Negative for difficulty urinating.  Musculoskeletal: Negative for arthralgias and myalgias.  Neurological: Negative for dizziness, tremors, light-headedness and headaches.  Hematological: Does not bruise/bleed easily.  Psychiatric/Behavioral: Negative for agitation, dysphoric mood, hallucinations, self-injury, sleep disturbance and suicidal ideas. The patient is not nervous/anxious.     Blood pressure 119/65, pulse 67, temperature 98.1 F (36.7 C), temperature source Oral, resp. rate 18, height 4\' 11"  (1.499 m), weight 71.7 kg, SpO2 98 %.Body mass index is 31.91 kg/m.  General Appearance: Casual and Well Groomed  Eye Contact::  Good  Speech:  Clear and Coherent and Normal Rate409  Volume:  Normal  Mood:  Euthymic  Affect:  Appropriate and Congruent  Thought Process:  Coherent, Goal Directed and Linear  Orientation:  Full (Time, Place, and Person)  Thought Content:  Logical.  No paranoid ideation, referential thinking or delusional content  Suicidal Thoughts:  No  Homicidal Thoughts:  No  Memory:  Immediate;   Good Recent;   Good Remote;   Good  Judgement:  Good  Insight:   Good  Psychomotor Activity:  Normal  Concentration:  Good  Recall:  Good  Fund of Knowledge:Good  Language: Good  Akathisia:  No    AIMS (if indicated):     Assets:  Communication Skills Desire for Improvement Financial Resources/Insurance Housing Leisure Time Physical Health Resilience Social Support Transportation Vocational/Educational  Sleep:  Number of Hours: 5.5  Cognition: WNL  ADL's:  Intact   Mental Status Per Nursing Assessment::   On Admission:  NA  Demographic Factors:  Caucasian and Separated  Loss Factors: Loss of significant relationship  Historical Factors: Prior suicide attempts  Risk Reduction Factors:   Sense of responsibility to family, Employed, Living with another person, especially a relative, Positive social support, Positive therapeutic relationship and Positive coping skills or problem solving skills  Continued Clinical Symptoms:  Depression, improving Previous Psychiatric Diagnoses and Treatments  Cognitive Features That Contribute To Risk:  None    Suicide Risk:  Mild:  Suicidal ideation of limited frequency, intensity, duration, and specificity.  There are no identifiable plans, no associated intent, mild dysphoria and related symptoms, good self-control (both objective and subjective assessment), few other risk factors, and identifiable protective factors, including available and accessible social support.   Follow-up Information    [facility]. Go on 10/23/2020.   Why: You have an appointment for medication management services with  10/25/2020 on 10/23/20 at 11:15 am.  This appointment will be held in person. Contact information: [address], [place]  Phone: [phone]       [facility]. Schedule an appointment as soon as possible for a visit.   Why: Please continue with your current therapist [phone] online for therapy services. Contact information: [email or web address]  [facility]  Follow up.   Why: This facility will contact you for admission when there is an open bed. Please contact them weekly for a status of avalibility.  Contact information: [address] [place], [place] [address]  [phone]       Alroy Bailiff A, LCAS Follow up.   Why: female therapist, Virtual for now Contact information: [address] [place] [address] [phone]               Plan Of Care/Follow-up recommendations:  Activity:  as tolerated Other:  Take medications as prescribed.  Keep follow up appointments with outpatient therapist and outpatient psychiatrist.  Structure time to stay busy and to have regular social contact with others.  Do not drink alcohol or use drugs.  Claudie Revering, MD 10/02/2020, 8:25 AM

## 2020-10-02 NOTE — Progress Notes (Signed)
  [facility] Adult Case Management Discharge Plan :  Will you be returning to the same living situation after discharge:  No. Will go to father's then fly to sister in [place] At discharge, do you have transportation home?: Yes,  via daughter  Do you have the ability to pay for your medications: Yes,  has private insurance  Release of information consent forms completed and in the chart;  Patient's signature needed at discharge.  Patient to Follow up at:  Follow-up Information    [facility] Follow up.   Why: This facility will contact you for admission when there is an open bed. Please contact them weekly for a status of avalibility.  Contact information: [address] [place], [place] [address]  [phone]       [facility] Follow up.   Specialty: Behavioral Health Contact information: [address] [place] [address] [phone]              Next level of care provider has access to [facility]:yes  Safety Planning and Suicide Prevention discussed: Yes,  sister, husband, son     Has patient been referred to the Quitline?: Patient refused referral  Patient has been referred for addiction treatment: N/A  Chrys Racer 10/02/2020, 9:14 AM
